# Patient Record
Sex: Male | Born: 1965 | Race: White | Hispanic: No | Marital: Married | Smoking: Former smoker
Health system: Southern US, Community
[De-identification: ages and names within clinical notes are randomized; demographics above are authoritative.]

## PROBLEM LIST (undated history)

## (undated) DIAGNOSIS — K611 Rectal abscess: Secondary | ICD-10-CM

## (undated) DIAGNOSIS — N400 Enlarged prostate without lower urinary tract symptoms: Secondary | ICD-10-CM

## (undated) DIAGNOSIS — E079 Disorder of thyroid, unspecified: Secondary | ICD-10-CM

## (undated) DIAGNOSIS — G4700 Insomnia, unspecified: Secondary | ICD-10-CM

## (undated) DIAGNOSIS — T7840XA Allergy, unspecified, initial encounter: Secondary | ICD-10-CM

## (undated) DIAGNOSIS — F4320 Adjustment disorder, unspecified: Secondary | ICD-10-CM

## (undated) DIAGNOSIS — IMO0002 Reserved for concepts with insufficient information to code with codable children: Secondary | ICD-10-CM

## (undated) HISTORY — DX: Insomnia, unspecified: G47.00

## (undated) HISTORY — DX: Benign prostatic hyperplasia without lower urinary tract symptoms: N40.0

## (undated) HISTORY — DX: Adjustment disorder, unspecified: F43.20

## (undated) HISTORY — PX: HAND SURGERY: SHX662

## (undated) HISTORY — DX: Allergy, unspecified, initial encounter: T78.40XA

## (undated) HISTORY — DX: Rectal abscess: K61.1

## (undated) HISTORY — DX: Reserved for concepts with insufficient information to code with codable children: IMO0002

## (undated) HISTORY — DX: Disorder of thyroid, unspecified: E07.9

## (undated) HISTORY — PX: TONSILLECTOMY: SHX5217

---

## 2006-05-04 ENCOUNTER — Encounter: Payer: Self-pay | Admitting: Internal Medicine

## 2006-09-29 ENCOUNTER — Encounter: Payer: Self-pay | Admitting: Internal Medicine

## 2007-10-04 ENCOUNTER — Encounter: Payer: Self-pay | Admitting: Internal Medicine

## 2007-11-09 ENCOUNTER — Encounter: Payer: Self-pay | Admitting: Internal Medicine

## 2008-03-13 ENCOUNTER — Ambulatory Visit: Payer: Self-pay | Admitting: Internal Medicine

## 2008-03-13 DIAGNOSIS — E039 Hypothyroidism, unspecified: Secondary | ICD-10-CM | POA: Insufficient documentation

## 2008-03-13 DIAGNOSIS — K6289 Other specified diseases of anus and rectum: Secondary | ICD-10-CM | POA: Insufficient documentation

## 2008-03-13 DIAGNOSIS — N400 Enlarged prostate without lower urinary tract symptoms: Secondary | ICD-10-CM | POA: Insufficient documentation

## 2008-03-13 DIAGNOSIS — N401 Enlarged prostate with lower urinary tract symptoms: Secondary | ICD-10-CM

## 2008-03-15 ENCOUNTER — Ambulatory Visit: Payer: Self-pay | Admitting: Internal Medicine

## 2008-03-15 LAB — CONVERTED CEMR LAB
ALT: 25 units/L (ref 0–53)
AST: 29 units/L (ref 0–37)
Albumin: 4.3 g/dL (ref 3.5–5.2)
Alkaline Phosphatase: 47 units/L (ref 39–117)
BUN: 11 mg/dL (ref 6–23)
Basophils Absolute: 0 10*3/uL (ref 0.0–0.1)
Basophils Relative: 0.1 % (ref 0.0–3.0)
Bilirubin, Direct: 0.2 mg/dL (ref 0.0–0.3)
CO2: 30 meq/L (ref 19–32)
Calcium: 9.3 mg/dL (ref 8.4–10.5)
Chloride: 106 meq/L (ref 96–112)
Cholesterol: 211 mg/dL (ref 0–200)
Creatinine, Ser: 1.1 mg/dL (ref 0.4–1.5)
Direct LDL: 98.4 mg/dL
Eosinophils Absolute: 0.3 10*3/uL (ref 0.0–0.7)
Eosinophils Relative: 3.7 % (ref 0.0–5.0)
GFR calc Af Amer: 94 mL/min
GFR calc non Af Amer: 77 mL/min
Glucose, Bld: 102 mg/dL — ABNORMAL HIGH (ref 70–99)
HCT: 41.9 % (ref 39.0–52.0)
HDL: 51.3 mg/dL (ref >39.0)
Hemoglobin: 14.6 g/dL (ref 13.0–17.0)
Lymphocytes Relative: 22.9 % (ref 12.0–46.0)
MCHC: 35 g/dL (ref 30.0–36.0)
MCV: 97.4 fL (ref 78.0–100.0)
Monocytes Absolute: 0.8 10*3/uL (ref 0.1–1.0)
Monocytes Relative: 10.9 % (ref 3.0–12.0)
Neutro Abs: 4.6 10*3/uL (ref 1.4–7.7)
Neutrophils Relative %: 62.4 % (ref 43.0–77.0)
Platelets: 231 10*3/uL (ref 150–400)
Potassium: 4.4 meq/L (ref 3.5–5.1)
RBC: 4.3 M/uL (ref 4.22–5.81)
RDW: 12.2 % (ref 11.5–14.6)
Sodium: 139 meq/L (ref 135–145)
TSH: 1.28 microintl units/mL (ref 0.35–5.50)
Total Bilirubin: 1.6 mg/dL — ABNORMAL HIGH (ref 0.3–1.2)
Total CHOL/HDL Ratio: 4.1
Total Protein: 6.8 g/dL (ref 6.0–8.3)
Triglycerides: 81 mg/dL (ref 0–149)
VLDL: 16 mg/dL (ref 0–40)
WBC: 7.4 10*3/uL (ref 4.5–10.5)

## 2008-03-17 ENCOUNTER — Ambulatory Visit: Payer: Self-pay | Admitting: Internal Medicine

## 2008-03-17 DIAGNOSIS — G4700 Insomnia, unspecified: Secondary | ICD-10-CM | POA: Insufficient documentation

## 2008-05-08 ENCOUNTER — Telehealth: Payer: Self-pay | Admitting: Internal Medicine

## 2008-09-18 ENCOUNTER — Ambulatory Visit: Payer: Self-pay | Admitting: Internal Medicine

## 2008-09-18 LAB — CONVERTED CEMR LAB: TSH: 1 microintl units/mL (ref 0.35–5.50)

## 2008-10-02 ENCOUNTER — Telehealth: Payer: Self-pay | Admitting: Internal Medicine

## 2008-11-16 ENCOUNTER — Telehealth: Payer: Self-pay | Admitting: Internal Medicine

## 2009-02-14 ENCOUNTER — Ambulatory Visit: Payer: Self-pay | Admitting: Internal Medicine

## 2009-02-15 ENCOUNTER — Encounter: Payer: Self-pay | Admitting: Internal Medicine

## 2009-03-14 LAB — CONVERTED CEMR LAB
ALT: 14 units/L (ref 0–53)
AST: 19 units/L (ref 0–37)
Albumin: 4.6 g/dL (ref 3.5–5.2)
Alkaline Phosphatase: 46 units/L (ref 39–117)
BUN: 10 mg/dL (ref 6–23)
Bilirubin, Direct: 0.1 mg/dL (ref 0.0–0.3)
CO2: 26 meq/L (ref 19–32)
Calcium: 9.6 mg/dL (ref 8.4–10.5)
Chloride: 105 meq/L (ref 96–112)
Cholesterol: 197 mg/dL (ref 0–200)
Creatinine, Ser: 1.12 mg/dL (ref 0.40–1.50)
Glucose, Bld: 92 mg/dL (ref 70–99)
HDL: 40 mg/dL (ref >39)
Indirect Bilirubin: 0.6 mg/dL (ref 0.0–0.9)
LDL Cholesterol: 119 mg/dL — ABNORMAL HIGH (ref 0–99)
Potassium: 4.6 meq/L (ref 3.5–5.3)
Sodium: 140 meq/L (ref 135–145)
TSH: 2.081 microintl units/mL (ref 0.350–4.500)
Total Bilirubin: 0.7 mg/dL (ref 0.3–1.2)
Total CHOL/HDL Ratio: 4.9
Total Protein: 6.9 g/dL (ref 6.0–8.3)
Triglycerides: 191 mg/dL — ABNORMAL HIGH (ref <150)
VLDL: 38 mg/dL (ref 0–40)

## 2009-08-17 ENCOUNTER — Ambulatory Visit: Payer: Self-pay | Admitting: Internal Medicine

## 2009-08-17 LAB — CONVERTED CEMR LAB
ALT: 22 units/L (ref 0–53)
AST: 22 units/L (ref 0–37)
Albumin: 4.7 g/dL (ref 3.5–5.2)
Alkaline Phosphatase: 47 units/L (ref 39–117)
BUN: 17 mg/dL (ref 6–23)
Basophils Absolute: 0 10*3/uL (ref 0.0–0.1)
Basophils Relative: 1 % (ref 0–1)
Bilirubin Urine: NEGATIVE
Bilirubin, Direct: 0.2 mg/dL (ref 0.0–0.3)
CO2: 26 meq/L (ref 19–32)
Calcium: 9.5 mg/dL (ref 8.4–10.5)
Chloride: 103 meq/L (ref 96–112)
Cholesterol: 217 mg/dL — ABNORMAL HIGH (ref 0–200)
Creatinine, Ser: 1.21 mg/dL (ref 0.40–1.50)
Eosinophils Absolute: 0.2 10*3/uL (ref 0.0–0.7)
Eosinophils Relative: 4 % (ref 0–5)
Glucose, Bld: 100 mg/dL — ABNORMAL HIGH (ref 70–99)
HCT: 43.3 % (ref 39.0–52.0)
HDL: 44 mg/dL (ref >39)
Hemoglobin, Urine: NEGATIVE
Hemoglobin: 14.6 g/dL (ref 13.0–17.0)
Indirect Bilirubin: 0.9 mg/dL (ref 0.0–0.9)
Ketones, ur: NEGATIVE mg/dL
LDL Cholesterol: 146 mg/dL — ABNORMAL HIGH (ref 0–99)
Leukocytes, UA: NEGATIVE
Lymphocytes Relative: 47 % — ABNORMAL HIGH (ref 12–46)
Lymphs Abs: 2.1 10*3/uL (ref 0.7–4.0)
MCHC: 33.7 g/dL (ref 30.0–36.0)
MCV: 95.2 fL (ref 78.0–100.0)
Monocytes Absolute: 0.6 10*3/uL (ref 0.1–1.0)
Monocytes Relative: 12 % (ref 3–12)
Neutro Abs: 1.6 10*3/uL — ABNORMAL LOW (ref 1.7–7.7)
Neutrophils Relative %: 35 % — ABNORMAL LOW (ref 43–77)
Nitrite: NEGATIVE
Platelets: 236 10*3/uL (ref 150–400)
Potassium: 4.4 meq/L (ref 3.5–5.3)
Protein, ur: NEGATIVE mg/dL
RBC: 4.55 M/uL (ref 4.22–5.81)
RDW: 12.2 % (ref 11.5–15.5)
Sodium: 138 meq/L (ref 135–145)
Specific Gravity, Urine: 1.02 (ref 1.005–1.030)
TSH: 2.474 microintl units/mL (ref 0.350–4.500)
Total Bilirubin: 1.1 mg/dL (ref 0.3–1.2)
Total CHOL/HDL Ratio: 4.9
Total Protein: 6.8 g/dL (ref 6.0–8.3)
Triglycerides: 136 mg/dL (ref <150)
Urine Glucose: NEGATIVE mg/dL
Urobilinogen, UA: 0.2 (ref 0.0–1.0)
VLDL: 27 mg/dL (ref 0–40)
WBC: 4.5 10*3/uL (ref 4.0–10.5)
pH: 6.5 (ref 5.0–8.0)

## 2009-08-21 ENCOUNTER — Ambulatory Visit: Payer: Self-pay | Admitting: Internal Medicine

## 2009-08-21 DIAGNOSIS — G5600 Carpal tunnel syndrome, unspecified upper limb: Secondary | ICD-10-CM | POA: Insufficient documentation

## 2009-08-29 ENCOUNTER — Encounter: Payer: Self-pay | Admitting: Internal Medicine

## 2009-09-12 ENCOUNTER — Encounter: Payer: Self-pay | Admitting: Internal Medicine

## 2009-11-13 ENCOUNTER — Telehealth: Payer: Self-pay | Admitting: Family

## 2009-11-13 ENCOUNTER — Ambulatory Visit: Payer: Self-pay | Admitting: Family

## 2009-11-13 ENCOUNTER — Ambulatory Visit (HOSPITAL_BASED_OUTPATIENT_CLINIC_OR_DEPARTMENT_OTHER): Admission: RE | Admit: 2009-11-13 | Discharge: 2009-11-13 | Payer: Self-pay | Admitting: Internal Medicine

## 2009-11-13 ENCOUNTER — Ambulatory Visit: Payer: Self-pay | Admitting: Diagnostic Radiology

## 2009-11-13 DIAGNOSIS — R1013 Epigastric pain: Secondary | ICD-10-CM | POA: Insufficient documentation

## 2009-11-13 LAB — CONVERTED CEMR LAB
AST: 21 units/L (ref 0–37)
Albumin: 4.8 g/dL (ref 3.5–5.2)
Alkaline Phosphatase: 43 units/L (ref 39–117)
Basophils Relative: 0 % (ref 0–1)
Hemoglobin: 14.6 g/dL (ref 13.0–17.0)
Indirect Bilirubin: 0.9 mg/dL (ref 0.0–0.9)
Lymphs Abs: 0.4 10*3/uL — ABNORMAL LOW (ref 0.7–4.0)
MCHC: 34 g/dL (ref 30.0–36.0)
MCV: 94.3 fL (ref 78.0–100.0)
Monocytes Absolute: 0.5 10*3/uL (ref 0.1–1.0)
Monocytes Relative: 6 % (ref 3–12)
Neutro Abs: 7.2 10*3/uL (ref 1.7–7.7)
RBC: 4.55 M/uL (ref 4.22–5.81)
Total Bilirubin: 1.1 mg/dL (ref 0.3–1.2)

## 2009-11-14 ENCOUNTER — Telehealth: Payer: Self-pay | Admitting: Family

## 2009-11-27 ENCOUNTER — Encounter (INDEPENDENT_AMBULATORY_CARE_PROVIDER_SITE_OTHER): Payer: Self-pay | Admitting: *Deleted

## 2009-11-27 ENCOUNTER — Ambulatory Visit: Payer: Self-pay | Admitting: Family

## 2009-12-28 ENCOUNTER — Encounter (INDEPENDENT_AMBULATORY_CARE_PROVIDER_SITE_OTHER): Payer: Self-pay | Admitting: *Deleted

## 2009-12-28 ENCOUNTER — Ambulatory Visit: Payer: Self-pay | Admitting: Gastroenterology

## 2010-01-14 ENCOUNTER — Ambulatory Visit: Payer: Self-pay | Admitting: Gastroenterology

## 2010-01-14 LAB — CONVERTED CEMR LAB: UREASE: NEGATIVE

## 2010-01-16 ENCOUNTER — Encounter: Payer: Self-pay | Admitting: Gastroenterology

## 2010-01-28 ENCOUNTER — Encounter: Payer: Self-pay | Admitting: Internal Medicine

## 2010-01-28 LAB — CONVERTED CEMR LAB: TSH: 2.056 microintl units/mL (ref 0.350–4.500)

## 2010-02-04 ENCOUNTER — Telehealth: Payer: Self-pay | Admitting: Internal Medicine

## 2010-02-05 ENCOUNTER — Encounter: Payer: Self-pay | Admitting: Internal Medicine

## 2010-02-12 ENCOUNTER — Ambulatory Visit (HOSPITAL_BASED_OUTPATIENT_CLINIC_OR_DEPARTMENT_OTHER): Admission: RE | Admit: 2010-02-12 | Discharge: 2010-02-12 | Payer: Self-pay | Admitting: Orthopedic Surgery

## 2010-02-22 ENCOUNTER — Ambulatory Visit: Payer: Self-pay | Admitting: Family

## 2010-02-22 DIAGNOSIS — L989 Disorder of the skin and subcutaneous tissue, unspecified: Secondary | ICD-10-CM | POA: Insufficient documentation

## 2010-02-22 DIAGNOSIS — K644 Residual hemorrhoidal skin tags: Secondary | ICD-10-CM | POA: Insufficient documentation

## 2010-08-01 ENCOUNTER — Encounter: Payer: Self-pay | Admitting: Internal Medicine

## 2010-08-02 ENCOUNTER — Encounter: Payer: Self-pay | Admitting: Internal Medicine

## 2010-08-05 ENCOUNTER — Telehealth: Payer: Self-pay | Admitting: Internal Medicine

## 2010-08-08 ENCOUNTER — Ambulatory Visit: Admit: 2010-08-08 | Payer: Self-pay | Admitting: Internal Medicine

## 2010-08-14 LAB — CONVERTED CEMR LAB: TSH: 2.35 microintl units/mL (ref 0.350–4.500)

## 2010-08-20 NOTE — Progress Notes (Signed)
Summary: u/s result, pls call with blood wk result  Phone Note Outgoing Call   Summary of Call: Left message for patient to return our call, abdominal ultrasound is normal. Initial call taken by: Lemont Fillers FNP,  November 13, 2009 4:38 PM  Follow-up for Phone Call        Pt returned my call and was notified of normal u/s result. Pt asked about blood work result and was notified that labs not final yet and we will call him with results.  Mervin Kung CMA  November 14, 2009 8:52 AM   Additional Follow-up for Phone Call Additional follow up Details #1::        called patient, reviewed labs.  States he is feeling a little better. Recommended that patient schedule follow up in 2 weeks.  He verbalizes understanding. Additional Follow-up by: Lemont Fillers FNP,  November 14, 2009 11:11 AM

## 2010-08-20 NOTE — Progress Notes (Signed)
Summary: TSH result  Phone Note Outgoing Call   Summary of Call: call pt - TSH is in normal range.  keep taking same dose of thyroid medication.  arrange repeat TSH in 6 months Initial call taken by: D. Thomos Lemons DO,  February 04, 2010 6:11 PM  Follow-up for Phone Call        Pt notified of result.  Repeat TSH scheduled for 08/08/09 @ [facility] [place]. Appt. reminder mailed to pt. Order faxed to lab.  Nicki Guadalajara Fergerson CMA Duncan Dull)  February 05, 2010 9:33 AM

## 2010-08-20 NOTE — Letter (Signed)
Summary: [facility]   Imported By: Lanelle Bal 09/27/2009 07:59:37  _____________________________________________________________________  External Attachment:    Type:   Image     Comment:   External Document

## 2010-08-20 NOTE — Miscellaneous (Signed)
Summary: lab order--tsh  Clinical Lists Changes  Orders: Added new Test order of T-TSH [phone]) - Signed

## 2010-08-20 NOTE — Miscellaneous (Signed)
Summary: Orders Update  Clinical Lists Changes  Orders: Added new Test order of TLB-H Pylori Screen Gastric Biopsy ([id number]) - Signed 

## 2010-08-20 NOTE — Procedures (Signed)
Summary: Upper Endoscopy  Patient: [name] Note: All result statuses are Final unless otherwise noted.  Tests: (1) Upper Endoscopy (EGD)   EGD Upper Endoscopy       DONE     [facility]     [address]     [place], [place]  [address]           ENDOSCOPY PROCEDURE REPORT           PATIENT:  Joel Owen, Joel Owen  MR#:  [id number]     BIRTHDATE:  [birth year], 44 yrs. old  GENDER:  male           ENDOSCOPIST:  Vania Rea. Jarold Motto, MD, [facility]     Referred by:           PROCEDURE DATE:  01/14/2010     PROCEDURE:  EGD with biopsy     ASA CLASS:  Class I     INDICATIONS:  abdominal pain           MEDICATIONS:   Fentanyl 75 mcg IV, Versed 8 mg IV     TOPICAL ANESTHETIC:           DESCRIPTION OF PROCEDURE:   After the risks benefits and     alternatives of the procedure were thoroughly explained, informed     consent was obtained.  The [facility] GIF-H180 [id number] endoscope was     introduced through the mouth and advanced to the second portion of     the duodenum, without limitations.  The instrument was slowly     withdrawn as the mucosa was fully examined.     <<PROCEDUREIMAGES>>           A hiatal hernia was found. SMALL 2 CM.HH NOTED  The upper, middle,     and distal third of the esophagus were carefully inspected and no     abnormalities were noted. The z-line was well seen at the GEJ. The     endoscope was pushed into the fundus which was normal including a     retroflexed view. The antrum,gastric body, first and second part     of the duodenum were unremarkable. CLO AND REGULAR BIOPSIES DONE     FOR H.PYLORI.    Retroflexed views revealed no abnormalities.     The scope was then withdrawn from the patient and the procedure     completed.           COMPLICATIONS:  None           ENDOSCOPIC IMPRESSION:     1) Hiatal hernia     2) Normal EGD     RECURRENT PROBABLE PUD.R/O H.PYLORI     RECOMMENDATIONS:     1) Await biopsy results     2) Rx CLO if positive           REPEAT  EXAM:  No           ______________________________     Vania Rea. Jarold Motto, MD, Clementeen Graham           CC:  Thomos Lemons, DO           n.     eSIGNED:   Vania Rea. Kaliyah Gladman at 01/14/2010 03:47 PM           Mechele Collin, [id number]  Note: An exclamation mark (!) indicates a result that was not dispersed into the flowsheet. Document Creation Date: 01/14/2010 3:47 PM _______________________________________________________________________  (1) Order result status: Final Collection or  observation date-time: 01/14/2010 15:40 Requested date-time:  Receipt date-time:  Reported date-time:  Referring Physician:   Ordering Physician: Sheryn Bison [phone]) Specimen Source:  Source: Launa Grill Order Number: [id number] Lab site:

## 2010-08-20 NOTE — Assessment & Plan Note (Signed)
Summary: CPX/HEA   Vital Signs:  Patient profile:   45 year old male Height:      72 inches Weight:      203.75 pounds BMI:     27.73 O2 Sat:      100 % on Room air Temp:     98.3 degrees F oral Pulse rate:   67 / minute Pulse rhythm:   regular Resp:     18 per minute BP sitting:   120 / 80  (left arm) Cuff size:   large  Vitals Entered By: Glendell Docker CMA (August 21, 2009 8:42 AM)  O2 Flow:  Room air  Primary Care Provider:  D. Thomos Lemons DO  CC:  CPX.  History of Present Illness: CPX  45 y/o white male for routine CPX.  int hx - occ feels mild rectal symptoms from prev abscess.   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    Physical Exam  General:  alert, well-developed, and well-nourished.   Head:  normocephalic and atraumatic.   Eyes:  pupils equal, pupils round, and pupils reactive to light.   Ears:  R ear normal and L ear normal.   Mouth:  Oral mucosa and oropharynx without lesions or exudates.  Teeth in good repair. Neck:  No  deformities, masses, or tenderness noted.no carotid bruits.   Lungs:  normal respiratory effort, normal breath sounds, no crackles, and no wheezes.   Heart:  normal rate, regular rhythm, no murmur, and no gallop.   Abdomen:  soft, non-tender, normal bowel sounds, no masses, no hepatomegaly, and no splenomegaly.   Msk:  positive tinels - right wrist.  slightly atrophy of thenar eminence Extremities:  No lower extremity edema  Neurologic:  cranial nerves II-XII intact and gait normal.   Psych:  normally interactive, good eye contact, not anxious appearing, and not depressed appearing.     Impression & Recommendations:  Problem # 1:  HEALTH MAINTENANCE EXAM (ICD-V70.0)  Reviewed adult health maintenance protocols.  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  no improvement with wrist splints.  refer to ortho  Orders: Orthopedic Referral (Ortho)  Complete Medication List: 1)  Synthroid 88 Mcg Tabs (Levothyroxine sodium) .... Take 1 tablet by mouth once a  day 2)  Viagra 100 Mg Tabs (Sildenafil citrate) .... Take 1 tablet by mouth once a day as needed 3)  Sulfamethoxazole-tmp Ds 800-160 Mg Tabs (Sulfamethoxazole-trimethoprim) .... One by mouth bid  Patient Instructions: 1)  Please schedule a follow-up appointment in 1 year 2)  TSH: 244.9 3)  Lab work in 6 months Prescriptions: SYNTHROID 88 MCG TABS (LEVOTHYROXINE SODIUM) Take 1 tablet by mouth once a day  #90 x 3   Entered and Authorized by:   D. Thomos Lemons DO   Signed by:   D. Thomos Lemons DO on 08/21/2009   Method used:   Electronically to        [id number] (retail)       [address] [place], [place]  [address]       Ph: [phone]       Fax:  [phone]   RxID:   [id number] VIAGRA 100 MG TABS (SILDENAFIL CITRATE) Take 1 tablet by mouth once a day as needed  #10 x 11   Entered and Authorized by:   D. Thomos Lemons DO   Signed by:   D. Thomos Lemons DO on 08/21/2009   Method used:   Print then Give to Patient   RxID:   [id number] SULFAMETHOXAZOLE-TMP DS 800-160 MG TABS (SULFAMETHOXAZOLE-TRIMETHOPRIM) one by mouth bid  #20 x 0   Entered and Authorized by:   D. Thomos Lemons DO   Signed by:   D. Thomos Lemons DO on 08/21/2009   Method used:   Print then Give to Patient   RxID:   [id number]    Immunization History:  Influenza Immunization History:    Influenza:  historical (05/07/2009)   Current Allergies (reviewed today): No known allergies

## 2010-08-20 NOTE — Letter (Signed)
Summary: New Patient letter  [facility]  [address] [place], [place] [address]   Phone: [phone]  Fax: [phone]       11/27/2009 MRN: [id number]  Joel Owen [address] [place], [place]  [address]  Dear Joel Owen,  Welcome to the Gastroenterology Division at [facility].    You are scheduled to see Dr.  Marina Goodell  on 12-26-09 at 3:30PM on the 3rd floor at [facility], [address]  We ask that you try to arrive at our office 15 minutes prior to your appointment time to allow for check-in.  We would like you to complete the enclosed self-administered evaluation form prior to your visit and bring it with you on the day of your appointment.  We will review it with you.  Also, please bring a complete list of all your medications or, if you prefer, bring the medication bottles and we will list them.  Please bring your insurance card so that we may make a copy of it.  If your insurance requires a referral to see a specialist, please bring your referral form from your primary care physician.  Co-payments are due at the time of your visit and may be paid by cash, check or credit card.     Your office visit will consist of a consult with your physician (includes a physical exam), any laboratory testing he/she may order, scheduling of any necessary diagnostic testing (e.g. x-ray, ultrasound, CT-scan), and scheduling of a procedure (e.g. Endoscopy, Colonoscopy) if required.  Please allow enough time on your schedule to allow for any/all of these possibilities.    If you cannot keep your appointment, please call [phone] to cancel or reschedule prior to your appointment date.  This allows [place] the opportunity to schedule an appointment for another patient in need of care.  If you do not cancel or reschedule by 5 p.m. the business day prior to your appointment date, you will be charged a $50.00 late cancellation/no-show fee.    Thank you for choosing [facility] for your medical needs.  We appreciate the opportunity to care for you.  Please visit [place] at our website  to learn more about our practice.                     Sincerely,                                                             The Gastroenterology Division

## 2010-08-20 NOTE — Miscellaneous (Signed)
Summary: TSH order  Clinical Lists Changes  Orders: Added new Test order of T-TSH ([id number]) - Signed 

## 2010-08-20 NOTE — Assessment & Plan Note (Signed)
Summary: freeze spot on L leg & hemorrhoids/dt--Rm 5   Vital Signs:  Patient profile:   45 year old male Height:      72 inches Weight:      196.50 pounds BMI:     26.75 Temp:     98.0 degrees F oral Pulse rate:   66 / minute Pulse rhythm:   regular Resp:     16 per minute BP sitting:   128 / 80  (right arm) Cuff size:   regular  Vitals Entered By: Mervin Kung CMA Duncan Dull) (February 22, 2010 10:38 AM) CC: Room 5  Pt states he has hemorrhoids. Would like to have places on arms and left leg assessed. Is Patient Diabetic? No   Primary Care Provider:  Dondra Spry DO  CC:  Room 5  Pt states he has hemorrhoids. Would like to have places on arms and left leg assessed.Marland Kitchen  History of Present Illness: Patient notes that he had carpal tunnel sugery several weeks ago.  He used pain meds post-operatively which caused him to become constipation.  As a result of his constiption, he has noticed for several weeks since that his hemorroids have been bothering him.  He has tried OTC Preparation H tucks pads, Epsom salt baths, and stool softners without improvement.  Denies rectal bleeding.  Has used "foam" in the past with good relief.  Also interested in having spot on leg looked at + 2 spots on arm.    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 No apparent alopecia or balding. Rectal:  + external hemorrhoid noted. Skin:  small flat slightly hypopigmented patches (one on right leg, 2 on arms)   Impression & Recommendations:  Problem # 1:  HEMORRHOIDS, EXTERNAL (ICD-455.3) Assessment Deteriorated Exacerbated by recent constipation.  Recommended that he continue the epsom salt baths, start miralax, trial of proctofoam.  If no improvement with the measures, pt was instructed to call us.  Problem # 2:  SKIN LESIONS, MULTIPLE (ICD-709.9) Assessment: Improved Skin lesions are benign appearing and barely visible- pt notes that they were more dry/flakey over the winter.  No need to freeze at this time- monitor.   Complete Medication List: 1)  Synthroid 88 Mcg Tabs (Levothyroxine sodium) .... Take 1 tablet by mouth once a day 2)  Viagra 100 Mg Tabs (Sildenafil citrate) .... Take 1 tablet by mouth once a day as needed 3)  Nexium 40 Mg Pack (Esomeprazole magnesium) .... One tablet by mouth daily 4)  Miralax Powd (Polyethylene glycol 3350) .Marland KitchenMarland KitchenMarland Kitchen 17 grams in 8 ounces of juice or water once daily 5)  Proctofoam Hc 1-1 % Foam (Hydrocortisone ace-pramoxine) .Marland KitchenMarland KitchenMarland Kitchen  Apply twice daily to the affected area x 1 week  Patient Instructions: 1)  Call if you develp bleeding from your hemorrhoid, if symptoms worsen, or if they do not improve. Prescriptions: PROCTOFOAM HC 1-1 % FOAM (HYDROCORTISONE ACE-PRAMOXINE) apply twice daily to the affected area x 1 week  #1 x 1   Entered and Authorized by:   Lemont Fillers FNP   Signed by:   Lemont Fillers FNP on 02/22/2010   Method used:   [facility] to        [phone][facility] (retail)       [address] [place], [place]  [address]       Ph: [phone]        Fax: [phone]   RxID:   [id number]   Current Allergies (reviewed today): No known allergies

## 2010-08-20 NOTE — Consult Note (Signed)
Summary: [facility]   Imported By: Lanelle Bal 09/13/2009 10:19:15  _____________________________________________________________________  External Attachment:    Type:   Image     Comment:   External Document

## 2010-08-20 NOTE — Assessment & Plan Note (Signed)
Summary: ULCER LIKE SYMPTOMS/MHF   Vital Signs:  Patient profile:   45 year old male Height:      72 inches Weight:      200.75 pounds BMI:     27.33 Temp:     98.6 degrees F oral Pulse rate:   96 / minute Pulse rhythm:   regular Resp:     16 per minute BP sitting:   130 / 82  (right arm) Cuff size:   regular  Vitals Entered By: Mervin Kung CMA (November 13, 2009 11:30 AM) CC: room 5  Pt states he has had a dull upper abd. pain with a couple of diarrhead episodes x 1 day. Diarrhea is better today, OTC meds have not helped abd. pain.  He states he had a similar episode a couple of years ago. Is Patient Diabetic? No Pain Assessment Patient in pain? yes     Location: abdomen Intensity: 6 Type: dull Onset of pain  Sudden   Primary Care Provider:  Dondra Spry DO  CC:  room 5  Pt states he has had a dull upper abd. pain with a couple of diarrhead episodes x 1 day. Diarrhea is better today and OTC meds have not helped abd. pain.  He states he had a similar episode a couple of years ago.Marland Kitchen  History of Present Illness: Mr Dulay is a 45 year old male who presents today with c/o abdominal pain which started last night.  Pain worsend at work.  Tried alka-selzer/tums without improvement.  Pt attributes these symptoms to red sauce and spicy food last night.  Pain is dull/epigastric rated 6/10.  3 "explosive BM's" yesterday.  Denies vomitting.  Denies nausea, + anorexia.  Reports history of similar symptoms several years ago- was treated with nexium and symptoms resolved.  Denies use of NSAIDS, denies melena or hematochezia.  Notes both mother and sistery have history of cholecystitis.   Allergies (verified): No Known Drug Allergies  Physical Exam  General:  Well-developed,well-nourished,in no acute distress; alert,appropriate and cooperative throughout examination Head:  Normocephalic and atraumatic without obvious abnormalities. No apparent alopecia or balding. Lungs:  Normal  respiratory effort, chest expands symmetrically. Lungs are clear to auscultation, no crackles or wheezes. Heart:  Normal rate and regular rhythm. S1 and S2 normal without gallop, murmur, click, rub or other extra sounds. Abdomen:  + bowel sounds.  Abdomen soft.  Very mild epigastric tenderness without guarding or rebound tenderness.   Impression & Recommendations:  Problem # 1:  EPIGASTRIC PAIN (ICD-789.06) Assessment New May be due to GERD/ulcer.  Will also check LFT's amylase, lipase and abdominal ultrasound to evaluated for pancreatitis, cholecystits.  Plan for patient to follow up in 2 weeks.  If symptoms persist or if they worsen plan referral to GI.   Pt instructed to go to ER if he develops severe abdominal pain, melena, hematochezia.   Orders: Misc. Referral (Misc. Ref) T-Hepatic Function [phone]) T-CBC w/Diff [phone]) T-Amylase [phone]) T-Lipase [phone])  Complete Medication List: 1)  Synthroid 88 Mcg Tabs (Levothyroxine sodium) .... Take 1 tablet by mouth once a day 2)  Viagra 100 Mg Tabs (Sildenafil citrate) .... Take 1 tablet by mouth once a day as needed 3)  Nexium 40 Mg Pack (Esomeprazole magnesium) .... One tablet by mouth daily  Patient Instructions: 1)  Please complete lab work and ultrasound today. 2)  Call if symptoms worsen (go to ER if severe) 3)  Follow up in 2 weeks.   Prescriptions: NEXIUM 40 MG  PACK (ESOMEPRAZOLE MAGNESIUM) one tablet by mouth daily  #30 x 1   Entered and Authorized by:   Lemont Fillers FNP   Signed by:   Lemont Fillers FNP on 11/13/2009   Method used:   [facility]ron[facility] to        [phone][facility] (retail)       [address] [place], [place]  [address]       Ph: [phone]       Fax: [phone]   RxID:   [id number]   Current Allergies (reviewed today): No known allergies

## 2010-08-20 NOTE — Assessment & Plan Note (Signed)
Summary: 1 week follow up/mhf   Vital Signs:  Patient profile:   45 year old male Height:      72 inches Weight:      199.50 pounds BMI:     27.15 Temp:     97.5 degrees F oral Pulse rate:   60 / minute Pulse rhythm:   regular Resp:     16 per minute BP sitting:   118 / 84  (right arm) Cuff size:   large  Vitals Entered By: Joel Owen CMA (Nov 27, 2009 9:26 AM) CC: room 4  1 week follow up. Is Patient Diabetic? No   Primary Care Provider:  Dondra Spry DO  CC:  room 4  1 week follow up.Marland Kitchen  History of Present Illness: Joel Owen is a 45 year old male who presents today for follow up of his epigastric pain and anorexia.  Last week he reported his pain as dull/epigastric in nature and rated his pain as 6/10.  He was placed on Nexium. Today he reports resolution of his epigastric pain. Denies black or bloody stools.  He did note some diarrhea last week which has resolved. .   Now he has returned to his baseline bowel function which is one loose stool a day. He notes that he has had similar symptoms two other times in the last 10 years which have resolved with initiation of Nexium.    Allergies (verified): No Known Drug Allergies  Physical Exam  General:  Well-developed,well-nourished,in no acute distress; alert,appropriate and cooperative throughout examination Lungs:  Normal respiratory effort, chest expands symmetrically. Lungs are clear to auscultation, no crackles or wheezes. Heart:  Normal rate and regular rhythm. S1 and S2 normal without gallop, murmur, click, rub or other extra sounds. Abdomen:  Bowel sounds positive,abdomen soft and non-tender without masses, organomegaly or hernias noted.   Impression & Recommendations:  Problem # 1:  EPIGASTRIC PAIN (ICD-789.06) Assessment Improved Lab work was unremarkable. Plan to continue PPI.  Suspect symptoms related to GERD +/- gastritis/ulcer.  Will plan referral to GI for further evaluation and possibly endoscopy given  recurrent nature of patient;s symptoms.  Patient also has concerns over chronic loose stools.    Gastroenterology Referral (GI)  Complete Medication List: 1)  Synthroid 88 Mcg Tabs (Levothyroxine sodium) .... Take 1 tablet by mouth once a day 2)  Viagra 100 Mg Tabs (Sildenafil citrate) .... Take 1 tablet by mouth once a day as needed 3)  Nexium 40 Mg Pack (Esomeprazole magnesium) .... One tablet by mouth daily  Patient Instructions: 1)  Continue Nexium. 2)  You will be contacted about your referral to Gastroenterology. Prescriptions: NEXIUM 40 MG PACK (ESOMEPRAZOLE MAGNESIUM) one tablet by mouth daily  #30 x 5   Entered and Authorized by:   Joel Fillers FNP   Signed by:   Joel Fillers FNP on 11/27/2009   Method used:   Electronically to        [facility] [phone][facility] (retail)       [address] [place], [place]  [address]       Ph: [phone]       Fax: [phone]   RxID:   [id number]   Current Allergies (reviewed today): No known allergies

## 2010-08-20 NOTE — Assessment & Plan Note (Signed)
Summary: EPIGASTRIC PAIN/YF   History of Present Illness Visit Type: Initial Consult Primary GI MD: Sheryn Bison MD FACP FAGA Primary Provider: Dondra Spry DO Requesting Provider: Sandford Craze, MD Chief Complaint: Pt has had 3 episodes over 1 year of epigastric pain, loss of appetite. Pt states he started on back on a PPI. Pt states he no longer has these symptoms but kept his appt anyway.  History of Present Illness:   45 year old Caucasian male referred for evaluation of recurrent epigastric abdominal pain over the last 7-8 years. These episodes began spontaneously without real precipitating events except for occasional spicy foods. Patient been treated with Nexium on 3 occasions with good response. He does drink beer regularly but denies alcohol abuse problems. There's been no history of hepatitis, pancreatitis, or other gastrointestinal or general medical problems except for mild hyporthyroidism.  The patient denies abuse of NSAIDs or cigarettes. He has not had nocturnal wakening, nausea vomiting, or any hepatobiliary symptoms. Review of labs and recent ultrasound were all normal.He has one regular solid bowel movement day without rectal bleeding or melena. He denies anorexia, weight loss, or any specific food intolerances.     GI Review of Systems      Denies abdominal pain, acid reflux, belching, bloating, chest pain, dysphagia with liquids, dysphagia with solids, heartburn, loss of appetite, nausea, vomiting, vomiting blood, weight loss, and  weight gain.        Denies anal fissure, black tarry stools, change in bowel habit, constipation, diarrhea, diverticulosis, fecal incontinence, heme positive stool, irritable bowel syndrome, jaundice, light color stool, liver problems, rectal bleeding, and  rectal pain.    Current Medications (verified): 1)  Synthroid 88 Mcg Tabs (Levothyroxine Sodium) .... Take 1 Tablet By Mouth Once A Day 2)  Viagra 100 Mg Tabs (Sildenafil Citrate)  .... Take 1 Tablet By Mouth Once A Day As Needed 3)  Nexium 40 Mg Pack (Esomeprazole Magnesium) .... One Tablet By Mouth Daily  Allergies (verified): No Known Drug Allergies  Past History:  Past medical, surgical, family and social histories (including risk factors) reviewed for relevance to current acute and chronic problems.  Past Medical History: Reviewed history from 08/21/2009 and no changes required. Hypothyroidism BPH Insomnia  History of adjustment disorder with depressed mood   hx of rectal abscess -s/p surgery   Past Surgical History: Reviewed history from 08/21/2009 and no changes required. Tonsillectomy 1971       Family History: Reviewed history from 08/21/2009 and no changes required. Mother is 53 y/o - htn Father is 98 y/o - Gout,  prostate problems CAD - no Stoke - no Breast Ca - no Colon Ca - no Prostate Ca - no       Social History: Reviewed history from 08/21/2009 and no changes required. Occupation:  Curator Divorced No children Alcohol use-yes (Avg 10 beers per week) Drug use-no       Review of Systems       The patient complains of change in vision.  The patient denies allergy/sinus, anemia, anxiety-new, arthritis/joint pain, back pain, blood in urine, breast changes/lumps, confusion, cough, coughing up blood, depression-new, fainting, fatigue, fever, headaches-new, hearing problems, heart murmur, heart rhythm changes, itching, menstrual pain, muscle pains/cramps, night sweats, nosebleeds, pregnancy symptoms, shortness of breath, skin rash, sleeping problems, sore throat, swelling of feet/legs, swollen lymph glands, thirst - excessive , urination - excessive , urination changes/pain, urine leakage, vision changes, and voice change.    Vital Signs:  Patient profile:   44  year old male Height:      72 inches Weight:      199.38 pounds BMI:     27.14 Pulse rate:   60 / minute Pulse rhythm:   regular BP sitting:   112 / 70  (right arm) Cuff  size:   regular  Vitals Entered By: Christie Nottingham CMA Duncan Dull) (December 28, 2009 9:03 AM)  Physical Exam  General:  Well developed, well nourished, no acute distress.healthy appearing.   Head:  Normocephalic and atraumatic. Eyes:  PERRLA, no icterus.exam deferred to patient's ophthalmologist.   Neck:  Supple; no masses or thyromegaly. Lungs:  Clear throughout to auscultation. Heart:  Regular rate and rhythm; no murmurs, rubs,  or bruits. Abdomen:  Soft, nontender and nondistended. No masses, hepatosplenomegaly or hernias noted. Normal bowel sounds. Extremities:  No clubbing, cyanosis, edema or deformities noted. Neurologic:  Alert and  oriented x4;  grossly normal neurologically. Psych:  Alert and cooperative. Normal mood and affect.   Impression & Recommendations:  Problem # 1:  EPIGASTRIC PAIN (ICD-789.06) Assessment Improved Probable H. pylori infection with recurrent peptic ulcer disease versus mucosal damage from ethanol use. I have asked him to continue daily Nexium and we will schedule outpatient endoscopy with biopsies for H. pylori. I see no need for repeat labs at this time.  Problem # 2:  SCREENING COLORECTAL-CANCER (ICD-V76.51) Assessment: Unchanged Colonoscopy at age 31 recommended  Problem # 3:  HYPOTHYROIDISM (ICD-244.9) Assessment: Improved Continue Synthroid 88 micrograms a day.  Patient Instructions: 1)  Copy sent to : Melissa O. Lendell Caprice, nurse practitioner 2)  Please continue current medications.  3)  Copy sent to : Dr. Thomos Lemons  4)  Conscious Sedation brochure given.  5)  Upper Endoscopy brochure given.   Appended Document: EPIGASTRIC PAIN/YF    Clinical Lists Changes  Orders: Added new Test order of EGD (EGD) - Signed

## 2010-08-20 NOTE — Letter (Signed)
Summary: Patient [name] Biopsy Results  [facility]  [address] [place], [place] [address]   Phone: [phone]  Fax: [phone]        January 16, 2010 MRN: [id number]    Joel Owen [address] [place], [place]  [address]    Dear [name],  I am pleased to inform you that the biopsies taken during your recent endoscopic examination did not show any evidence of cancer upon pathologic examination.  Additional information/recommendations:  __No further action is needed at this time.  Please follow-up with      your primary care physician for your other healthcare needs.  __ Please call [phone] to schedule a return visit to review      your condition.  _x_ Continue with the treatment plan as outlined on the day of your      exam.No helicobacter infection noted in biopsies.  __ You should have a repeat endoscopic examination for this problem              in _ months/years.   Please call us if you are having persistent problems or have questions about your condition that have not been fully answered at this time.  Sincerely,  Mardella Layman MD [facility]  This letter has been electronically signed by your physician.  Appended Document: Patient Notice-Endo Biopsy Results letter mailed 7.6.11

## 2010-08-20 NOTE — Letter (Signed)
Summary: EGD Instructions  [facility]  [address] [place], [place] [address]   Phone: [phone]  Fax: [phone]       Joel Owen    04-30-1966    MRN: [id number]       Procedure Day /Date: Monday, 01/14/10     Arrival Time: 3:00     Procedure Time: 4:00     Location of Procedure:                    [facility] (4th Floor)    PREPARATION FOR ENDOSCOPY   On 01/14/10 THE DAY OF THE PROCEDURE:  1.   No solid foods, milk or milk products are allowed after midnight the night before your procedure.  2.   Do not drink anything colored red or purple.  Avoid juices with pulp.  No orange juice.  3.  You may drink clear liquids until 2:00, which is 2 hours before your procedure.                                                                                                CLEAR LIQUIDS INCLUDE: Water Jello Ice Popsicles Tea (sugar ok, no milk/cream) Powdered fruit flavored drinks Coffee (sugar ok, no milk/cream) Gatorade Juice: apple, white grape, white cranberry  Lemonade Clear bullion, consomm, broth Carbonated beverages (any kind) Strained chicken noodle soup Hard Candy   MEDICATION INSTRUCTIONS  Unless otherwise instructed, you should take regular prescription medications with a small sip of water as early as possible the morning of your procedure.                    OTHER INSTRUCTIONS  You will need a responsible adult at least 47 years of age to accompany you and drive you home.   This person must remain in the waiting room during your procedure.  Wear loose fitting clothing that is easily removed.  Leave jewelry and other valuables at home.  However, you may wish to bring a book to read or an iPod/MP3 player to listen to music as you wait for your procedure to start.  Remove all body piercing jewelry and leave at home.  Total time from sign-in until discharge is approximately 2-3 hours.  You should go home  directly after your procedure and rest.  You can resume normal activities the day after your procedure.  The day of your procedure you should not:   Drive   Make legal decisions   Operate machinery   Drink alcohol   Return to work  You will receive specific instructions about eating, activities and medications before you leave.    The above instructions have been reviewed and explained to me by   _______________________    I fully understand and can verbalize these instructions _____________________________ Date _________

## 2010-08-20 NOTE — Progress Notes (Signed)
Summary: Lab Results  Phone Note Call from Patient Call back at Home Phone [phone]   Caller: Patient Reason for Call: Lab or Test Results Summary of Call: Patient called and left voice message requesting his lab results Initial call taken by: Glendell Docker CMA,  November 14, 2009 2:29 PM  Follow-up for Phone Call        done at 11:15 this am. Follow-up by: Lemont Fillers FNP,  November 14, 2009 2:56 PM

## 2010-08-21 ENCOUNTER — Encounter: Payer: Self-pay | Admitting: Internal Medicine

## 2010-08-21 ENCOUNTER — Ambulatory Visit: Admit: 2010-08-21 | Payer: Self-pay | Admitting: Internal Medicine

## 2010-08-21 ENCOUNTER — Encounter (INDEPENDENT_AMBULATORY_CARE_PROVIDER_SITE_OTHER): Payer: 59 | Admitting: Internal Medicine

## 2010-08-21 DIAGNOSIS — E785 Hyperlipidemia, unspecified: Secondary | ICD-10-CM | POA: Insufficient documentation

## 2010-08-21 DIAGNOSIS — Z0000 Encounter for general adult medical examination without abnormal findings: Secondary | ICD-10-CM

## 2010-08-22 LAB — CONVERTED CEMR LAB
LDL Cholesterol: 123 mg/dL — ABNORMAL HIGH (ref 0–99)
Triglycerides: 78 mg/dL (ref <150)
VLDL: 16 mg/dL (ref 0–40)

## 2010-08-22 NOTE — Progress Notes (Signed)
Summary: Synthroid Refill  Phone Note Refill Request Message from:  Fax from Pharmacy on August 05, 2010 9:21 AM  Refills Requested: Medication #1:  SYNTHROID 88 MCG TABS Take 1 tablet by mouth once a day   Dosage confirmed as above?Dosage Confirmed   Brand Name Necessary? No   Supply Requested: 3 months   [facility] [address] [facility] [phone]   Method Requested: Electronic Next Appointment Scheduled: 08-08-10 LAB  Initial call taken by: Roselle Locus,  August 05, 2010 9:23 AM  Follow-up for Phone Call        ok to refill x 1 until next OV Follow-up by: D. Thomos Lemons DO,  August 05, 2010 12:35 PM  Additional Follow-up for Phone Call Additional follow up Details #1::        Rx sent electronically to pharmacy Glendell Docker CMA  August 05, 2010 1:55 PM     Prescriptions: SYNTHROID 88 MCG TABS (LEVOTHYROXINE SODIUM) Take 1 tablet by mouth once a day  #30 x 0   Entered by:   Glendell Docker CMA   Authorized by:   D. Thomos Lemons DO   Signed by:   Glendell Docker CMA on 08/05/2010   Method used:   [facility] [phone][facility] (retail)       [address] [place], [place]  [address]       Ph: [phone]       Fax: [phone]   RxID:   [id number]

## 2010-08-26 ENCOUNTER — Encounter: Payer: Self-pay | Admitting: Internal Medicine

## 2010-08-28 NOTE — Assessment & Plan Note (Signed)
Summary: cpx/mhf   Vital Signs:  Patient profile:   45 year old male Height:      72 inches Weight:      198.25 pounds BMI:     26.98 O2 Sat:      100 % on Room air Temp:     98.2 degrees F oral Pulse rate:   66 / minute Resp:     16 per minute BP sitting:   114 / 76  (right arm) Cuff size:   large  Vitals Entered By: Glendell Docker CMA (August 21, 2010 8:45 AM)  O2 Flow:  Room air CC: CPX, Back Pain Is Patient Diabetic? No Pain Assessment Patient in pain? no      Comments right carpal tunnel surgey in right hand july 2011, fistula-seton-procdure-11/30-Dr Carolynne Edouard   Primary Care Provider:  Dondra Spry DO  CC:  CPX and Back Pain.  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 Pylori 12/2009 ( Dr. Jarold Motto ) Right carpal tunnel hx - Dr. 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    Physical Exam  General:  alert, well-developed, and well-nourished.   Head:  normocephalic and atraumatic.   Eyes:  pupils equal and pupils round.   Ears:  R ear normal and L ear normal.   Lungs:  normal respiratory effort, normal breath sounds, no crackles, and no wheezes.   Heart:  normal rate, regular rhythm, no murmur, and no gallop.   Abdomen:  soft, non-tender, normal bowel sounds, no hepatomegaly, and no splenomegaly.   Neurologic:  cranial nerves II-XII intact and gait normal.   Psych:  normally interactive, good eye contact, not anxious appearing, and not depressed appearing.     Impression & Recommendations:  Problem # 1:  HEALTH MAINTENANCE EXAM (ICD-V70.0) Reviewed adult health maintenance protocols.  Td Booster: given (10/05/2007)   Flu Vax: Historical (04/30/2010)   Chol: 217 (08/17/2009)   HDL: 44 (08/17/2009)   LDL: 146 (08/17/2009)   TG: 136 (08/17/2009) TSH: 2.350 (08/14/2010)  Complete Medication List: 1)  Synthroid 88 Mcg Tabs (Levothyroxine sodium) .... Take 1 tablet by mouth once a day 2)  Viagra 100 Mg Tabs (Sildenafil citrate) .... Take 1 tablet by mouth once a day as needed 3)  Nexium 40 Mg Pack (Esomeprazole magnesium) .... One tablet by mouth daily  Other Orders: T-Lipid Profile ([id number]) CRP, high sensitivity-[facility] [phone]) T- * Misc. Laboratory test [id number])  Patient Instructions: 1)  Please schedule a follow-up appointment in 1 year. 2)  Please schedule a follow-up appointment as needed.   Orders Added: 1)  T-Lipid Profile [[id number]] 2)  CRP, high sensitivity-[facility] [[id number]] 3)   T- * Misc. Laboratory test [99999] 4)  Est. Patient 47-64 years [[address]396]   Immunization History:  Influenza Immunization History:    Influenza:  historical (04/30/2010)   Immunization History:  Influenza Immunization History:    Influenza:  Historical (04/30/2010)   Current Allergies (reviewed today): No known allergies

## 2010-09-05 NOTE — Letter (Signed)
   [place] [facility] [address] [place], [place]  [address]  [place] Phone: [phone]      August 26, 2010   ANDREU DRUDGE [address] [place], [place] [address]  RE:  LAB RESULTS  Dear  Mr. Showers,  The following is an interpretation of your most recent lab tests.  Please take note of any instructions provided or changes to medications that have resulted from your lab work.  LIPID PANEL:  Stable - no changes needed Triglyceride: 78   Cholesterol: 185   LDL: 123   HDL: 46   Chol/HDL%:  4.0 Ratio  THYROID STUDIES:  Thyroid studies normal TSH: 2.350     C Reactive Protein - normal (excellent)      Sincerely Yours,    Dr. Thomos Lemons  Appended Document:  mailed 08/26/10

## 2010-10-07 ENCOUNTER — Telehealth: Payer: Self-pay | Admitting: Internal Medicine

## 2010-10-10 ENCOUNTER — Telehealth: Payer: Self-pay | Admitting: Internal Medicine

## 2010-10-10 NOTE — Telephone Encounter (Signed)
viagra 100mg  tablet by mouth once a day as needed qty 10 last fill 1.7.12

## 2010-10-14 NOTE — Telephone Encounter (Signed)
Rx filled 10/07/2010

## 2010-10-17 NOTE — Progress Notes (Signed)
Summary: refill-viagra  Phone Note Refill Request   Refills Requested: Medication #1:  VIAGRA 100 MG TABS Take 1 tablet by mouth once a day as needed   Dosage confirmed as above?Dosage Confirmed   Brand Name Necessary? No   [facility] Requested[facility] [address] [place] [address] fax [phone]   Method Requested: Electronic Next Appointment Scheduled: 2.4.13 Sandford Diop Initial call taken by: Elba Barman,  October 07, 2010 8:54 AM    Prescriptions: VIAGRA 100 MG TABS (SILDENAFIL CITRATE) Take 1 tablet by mouth once a day as needed  #10 x 11   Entered by:   Glendell Docker CMA   Authorized by:   D. Thomos Lemons DO   Signed by:   Glendell Docker CMA on 10/07/2010   Method used:   [facility]ronically [facility]        [facility] [phone][facility] (retail)       [address] [place], [place]  [address]       Ph: [phone]       Fax: [phone]   RxID:   [id number]

## 2010-11-08 ENCOUNTER — Ambulatory Visit (HOSPITAL_BASED_OUTPATIENT_CLINIC_OR_DEPARTMENT_OTHER): Admission: RE | Admit: 2010-11-08 | Payer: 59 | Source: Ambulatory Visit | Admitting: Orthopedic Surgery

## 2010-12-02 ENCOUNTER — Telehealth: Payer: Self-pay | Admitting: Internal Medicine

## 2010-12-02 DIAGNOSIS — E039 Hypothyroidism, unspecified: Secondary | ICD-10-CM

## 2010-12-02 MED ORDER — LEVOTHYROXINE SODIUM 88 MCG PO TABS: 88.0000 ug | ORAL_TABLET | Freq: Every day | ORAL | Status: DC

## 2010-12-02 NOTE — Telephone Encounter (Signed)
Refill- synthroid tablet. Take 1 tablet by mouth once a day. Qty 30. Last fill 4.14.12

## 2010-12-02 NOTE — Telephone Encounter (Signed)
Rx refill sent to pharmacy. 

## 2011-02-04 ENCOUNTER — Telehealth: Payer: Self-pay | Admitting: *Deleted

## 2011-02-04 DIAGNOSIS — E039 Hypothyroidism, unspecified: Secondary | ICD-10-CM

## 2011-02-04 NOTE — Telephone Encounter (Signed)
Patient states he gets his throid level  ck every 6 months   Needs order

## 2011-02-05 NOTE — Telephone Encounter (Signed)
Pt had CPE and TSH in 2/12.  Advised to follow up CPE in one year.  No advice on when to follow up TSH.  Does pt need to follow up now?  Please advise.

## 2011-02-05 NOTE — Telephone Encounter (Signed)
Fine for tsh and free t4 dx hypothyroidism

## 2011-02-06 LAB — TSH: TSH: 2.125 u[IU]/mL (ref 0.350–4.500)

## 2011-02-06 NOTE — Telephone Encounter (Signed)
Pt presented to the lab today for blood work

## 2011-02-06 NOTE — Telephone Encounter (Signed)
Pt has already had labs drawn.  Order entered a faxed to Katrina in lab.

## 2011-02-06 NOTE — Telephone Encounter (Signed)
Pending orders released and forwarded to the lab.

## 2011-02-06 NOTE — Telephone Encounter (Signed)
Addended by: Mervin Kung A on: 02/06/2011 11:57 AM   Modules accepted: Orders

## 2011-02-07 ENCOUNTER — Ambulatory Visit (HOSPITAL_BASED_OUTPATIENT_CLINIC_OR_DEPARTMENT_OTHER)
Admission: RE | Admit: 2011-02-07 | Discharge: 2011-02-07 | Disposition: A | Discharge status: Home / Self Care | Payer: 59 | Source: Ambulatory Visit | Attending: Orthopedic Surgery | Admitting: Orthopedic Surgery

## 2011-02-07 DIAGNOSIS — E039 Hypothyroidism, unspecified: Secondary | ICD-10-CM | POA: Insufficient documentation

## 2011-02-07 DIAGNOSIS — K219 Gastro-esophageal reflux disease without esophagitis: Secondary | ICD-10-CM | POA: Insufficient documentation

## 2011-02-07 DIAGNOSIS — G5600 Carpal tunnel syndrome, unspecified upper limb: Secondary | ICD-10-CM | POA: Insufficient documentation

## 2011-02-07 HISTORY — PX: CARPAL TUNNEL RELEASE: SHX101

## 2011-02-07 LAB — T4, FREE: Free T4: 1.1 ng/dL (ref 0.80–1.80)

## 2011-02-18 NOTE — Op Note (Addendum)
NAMEJAZION, [name]                  ACCOUNT NO.:  [id number]  MEDICAL RECORD NO.:  [id number]  LOCATION:                                 FACILITY:  PHYSICIAN:  Katy Fitch. Xzavian Semmel, M.D.      DATE OF BIRTH:  DATE OF PROCEDURE:  02/07/2011 DATE OF DISCHARGE:                              OPERATIVE REPORT   PREOPERATIVE DIAGNOSIS:  Entrapment neuropathy median nerve, left carpal tunnel.  POSTOPERATIVE DIAGNOSIS:  Entrapment neuropathy median nerve, left carpal tunnel.  OPERATION:  Release of left transverse carpal ligament.  OPERATING SURGEON:  Katy Fitch. Vonnie Spagnolo, MD  ASSISTANT:  Marveen Reeks Dasnoit, PA-C  ANESTHESIA:  General by LMA.  SUPERVISED ANESTHESIOLOGIST:  Bedelia Person, MD  INDICATIONS:  Joel Owen is a 45 year old gentleman employed by [employer] who has had a history of bilateral carpal tunnel syndrome.  He was referred through the courtesy of his primary care physician, Dr. Thomos Lemons for evaluation and management.  Clinical examination suggested median entrapment neuropathy at wrist level. Electrodiagnostic studies confirmed entrapment neuropathy.  Due to a failed respond to nonoperative measures, he was brought to the operating room on February 12, 2010, for release of his right transcarpal ligament. He has had a very satisfactory result following that procedure and now presents for similar surgery on the left.  Preoperatively, he was reminded of the potential risks and benefits of surgery.  Questions were invited and answered in detail.  PROCEDURE:  Joel Owen was brought to room one of the [facility] and placed in supine position on the operating table.  Following the induction of general anesthesia by LMA technique under Dr. Burnett Corrente direct supervision, the left arm was prepped with Betadine soap solution and sterilely draped.  A pneumatic tourniquet was applied to the proximal left brachium.  Upon exsanguination of the left arm with Esmarch bandage,  arterial tourniquet was inflated to 220 mmHg. Procedure commenced with a short incision in line of the ring finger and the palm.  Subcutaneous tissues were carefully divided revealing the palmar fascia.  This was split longitudinally to the common sensory branch of the median nerve and the superficial palmar arch.  The carpal canal was sound with a Catering manager.  A pathway was created palmar and deep to the transverse carpal ligament with a Penfield 4 elevator and scissors followed by release of the ligament along its ulnar border extending into the distal forearm.  This widely opened the carpal canal.  No masses or other predicaments noted.  Bleeding points were not problematic.  The wound was then repaired with intradermal 2-0 Prolene suture.  A compressive dressing was applied with a volar plaster splint maintaining the wrist in 10 degrees of dorsiflexion.  For aftercare, Joel Owen was provided prescription for Percocet 5 mg 1 p.o. q.4-6 h. p.r.n. pain 20 tablets without refill.  We will see him back for followup in a week for dressing change, suture removal and advancement to an exercise program.     Katy Fitch. Lean Jaeger, M.D.     RVS/MEDQ  D:  02/07/2011  T:  02/07/2011  Job:  [id number]  cc:   Barbette Hair. Artist Pais,  DO  Electronically Signed by Josephine Igo M.D. on 02/20/2011 09:59:07 AM

## 2011-02-20 ENCOUNTER — Ambulatory Visit (INDEPENDENT_AMBULATORY_CARE_PROVIDER_SITE_OTHER): Payer: 59 | Admitting: Internal Medicine

## 2011-02-20 ENCOUNTER — Encounter: Payer: Self-pay | Admitting: Internal Medicine

## 2011-02-20 DIAGNOSIS — E039 Hypothyroidism, unspecified: Secondary | ICD-10-CM

## 2011-02-20 DIAGNOSIS — L989 Disorder of the skin and subcutaneous tissue, unspecified: Secondary | ICD-10-CM

## 2011-02-20 DIAGNOSIS — N529 Male erectile dysfunction, unspecified: Secondary | ICD-10-CM

## 2011-02-20 MED ORDER — SILDENAFIL CITRATE 100 MG PO TABS: 100.0000 mg | ORAL_TABLET | Freq: Every day | ORAL | Status: DC | PRN

## 2011-02-20 NOTE — Patient Instructions (Signed)
Please schedule cbc, chem7, lipid, lft, psa, ua, tsh and free t4 1 week before Feb 2013 physical (v70.0)

## 2011-02-23 ENCOUNTER — Encounter: Payer: Self-pay | Admitting: Internal Medicine

## 2011-02-23 DIAGNOSIS — N529 Male erectile dysfunction, unspecified: Secondary | ICD-10-CM | POA: Insufficient documentation

## 2011-02-23 DIAGNOSIS — L989 Disorder of the skin and subcutaneous tissue, unspecified: Secondary | ICD-10-CM | POA: Insufficient documentation

## 2011-02-23 NOTE — Progress Notes (Signed)
  Subjective:    Patient ID: Joel Owen, male    DOB: [birth year], 45 y.o.   MRN: [id number]  HPI Pt presents to clinic for evaluation of multiple medical problems. C/o chronic intermittent ED previously responsive to viagra prn without adverse effects. Complains of right arm lesion that has been present x 1year but recently irritated. No suspicious changes recalled. H/o hypothyroidism without sx's of hyper or hypo. Reviewed nl tsh/ft4 7/12. CPE scheduled 2/13. No other complaints.  Reviewed pmh, medications and allergies.  Review of Systems see hpi    Objective:   Physical Exam  Nursing note and vitals reviewed. Constitutional: He appears well-developed and well-nourished. No distress.  HENT:  Head: Normocephalic and atraumatic.  Eyes: Conjunctivae are normal. No scleral icterus.  Neurological: He is alert.  Skin: Skin is warm and dry. No rash noted. He is not diaphoretic.       Right upper arm with small 1/2cm slightly raised hyperpigmented skin lesion. No malignant features. Appears slightly red and irritated.  Psychiatric: He has a normal mood and affect.          Assessment & Plan:

## 2011-02-23 NOTE — Assessment & Plan Note (Signed)
Rf viagra for prn use. No nitrate use.

## 2011-02-23 NOTE — Assessment & Plan Note (Signed)
Appears as benign lesion but irritated. Pt requests tx due to pain/irritation. Discussed risks, benefits and potential complications of cryotherapy. Stated understanding and a desire to proceed. Using liquid nitrogen and small cotton tipped applicator multiple brief applications for 2-3 seconds performed until halo effect achieved. Pt tolerated procedure without difficulty and will monitor the area for change over the next 1-2 wks.

## 2011-02-23 NOTE — Assessment & Plan Note (Signed)
Stable. Asx. Continue current dosing. 

## 2011-04-18 ENCOUNTER — Telehealth: Payer: Self-pay | Admitting: Internal Medicine

## 2011-04-18 DIAGNOSIS — Z0000 Encounter for general adult medical examination without abnormal findings: Secondary | ICD-10-CM

## 2011-04-18 NOTE — Telephone Encounter (Signed)
Please order labs for one week prior to 08-28-11 cpe visit. He will be going downstairs to [employer] [facility].

## 2011-04-18 NOTE — Telephone Encounter (Signed)
Labs entered for [place]  

## 2011-06-04 ENCOUNTER — Telehealth: Payer: Self-pay | Admitting: Internal Medicine

## 2011-06-04 DIAGNOSIS — E039 Hypothyroidism, unspecified: Secondary | ICD-10-CM

## 2011-06-04 MED ORDER — LEVOTHYROXINE SODIUM 88 MCG PO TABS: 88.0000 ug | ORAL_TABLET | Freq: Every day | ORAL | Status: DC

## 2011-06-04 NOTE — Telephone Encounter (Signed)
Refill synthroid tablet last fill 10 6 2012 qty 30 take 1 tablet by mouth daily

## 2011-06-04 NOTE — Telephone Encounter (Signed)
Rx refill sent to pharmacy. 

## 2011-08-25 ENCOUNTER — Encounter: Payer: 59 | Admitting: Internal Medicine

## 2011-08-28 ENCOUNTER — Encounter: Payer: 59 | Admitting: Internal Medicine

## 2011-08-29 LAB — LIPID PANEL
HDL: 39 mg/dL — ABNORMAL LOW (ref >39)
LDL Cholesterol: 135 mg/dL — ABNORMAL HIGH (ref 0–99)
Total CHOL/HDL Ratio: 4.9 Ratio
Triglycerides: 83 mg/dL (ref <150)
VLDL: 17 mg/dL (ref 0–40)

## 2011-08-29 LAB — CBC
Platelets: 224 10*3/uL (ref 150–400)
RBC: 4.39 MIL/uL (ref 4.22–5.81)
RDW: 12.1 % (ref 11.5–15.5)
WBC: 6.4 10*3/uL (ref 4.0–10.5)

## 2011-08-29 NOTE — Telephone Encounter (Signed)
Addended by: Mervin Kung A on: 08/29/2011 08:47 AM   Modules accepted: Orders

## 2011-08-29 NOTE — Telephone Encounter (Signed)
Addended by: Mervin Kung A on: 08/29/2011 08:43 AM   Modules accepted: Orders

## 2011-08-30 LAB — BASIC METABOLIC PANEL
CO2: 21 mEq/L (ref 19–32)
Chloride: 103 mEq/L (ref 96–112)
Creat: 1.18 mg/dL (ref 0.50–1.35)
Potassium: 4.3 mEq/L (ref 3.5–5.3)
Sodium: 139 mEq/L (ref 135–145)

## 2011-08-30 LAB — URINALYSIS, ROUTINE W REFLEX MICROSCOPIC
Bilirubin Urine: NEGATIVE
Nitrite: NEGATIVE
Protein, ur: NEGATIVE mg/dL
Urobilinogen, UA: 0.2 mg/dL (ref 0.0–1.0)

## 2011-08-30 LAB — HEPATIC FUNCTION PANEL
Albumin: 4.8 g/dL (ref 3.5–5.2)
Alkaline Phosphatase: 50 U/L (ref 39–117)
Total Protein: 6.7 g/dL (ref 6.0–8.3)

## 2011-08-30 LAB — PSA: PSA: 0.25 ng/mL (ref <=4.00)

## 2011-08-30 LAB — T4, FREE: Free T4: 1.33 ng/dL (ref 0.80–1.80)

## 2011-09-05 ENCOUNTER — Ambulatory Visit (INDEPENDENT_AMBULATORY_CARE_PROVIDER_SITE_OTHER): Payer: 59 | Admitting: Internal Medicine

## 2011-09-05 ENCOUNTER — Encounter: Payer: Self-pay | Admitting: Internal Medicine

## 2011-09-05 VITALS — BP 112/80 | HR 67 | Temp 98.1°F | Resp 16 | Ht 72.0 in | Wt 201.0 lb

## 2011-09-05 DIAGNOSIS — E039 Hypothyroidism, unspecified: Secondary | ICD-10-CM

## 2011-09-05 DIAGNOSIS — E785 Hyperlipidemia, unspecified: Secondary | ICD-10-CM

## 2011-09-05 DIAGNOSIS — Z0000 Encounter for general adult medical examination without abnormal findings: Secondary | ICD-10-CM

## 2011-09-05 MED ORDER — SILDENAFIL CITRATE 100 MG PO TABS: 100.0000 mg | ORAL_TABLET | Freq: Every day | ORAL | Status: DC | PRN

## 2011-09-05 MED ORDER — LEVOTHYROXINE SODIUM 88 MCG PO TABS: 88.0000 ug | ORAL_TABLET | Freq: Every day | ORAL | Status: DC

## 2011-09-05 NOTE — Patient Instructions (Signed)
Please schedule fasting lipid 272.4 and tsh/free t4 (hypothyroidism) prior to next visit

## 2011-09-05 NOTE — Assessment & Plan Note (Signed)
Nl exam. cpe labs reviewed. EKG obtained demonstrating SB 58 with nl intervals and axis.

## 2011-09-05 NOTE — Progress Notes (Signed)
  Subjective:    Patient ID: Joel Owen, male    DOB: 02-18-1966, 46 y.o.   MRN: [id number]  HPI Pt presents to clinic for annual physical. No current complaints. Reviewed chol mildly elevated despite low fat diet and regular exercise. Hypothyroidism under good control with stable dosing.   Past Medical History  Diagnosis Date  . Thyroid disease     hypothyroidism  . BPH (benign prostatic hyperplasia)   . Insomnia   . Adjustment disorder     with depressed mood  . Rectal abscess     history of, s/p surgery   Past Surgical History  Procedure Date  . Tonsillectomy     1971  . Carpal tunnel release 02/07/2011    Dr  Sylvester Harder    reports that he has quit smoking. He has quit using smokeless tobacco. He reports that he drinks about 6 ounces of alcohol per week. He reports that he does not use illicit drugs. family history includes Gout in his father and Hypertension in his mother. Allergies  Allergen Reactions  . Codeine     itching     Review of Systems see hpi     Objective:   Physical Exam  Physical Exam  Nursing note and vitals reviewed. Constitutional: He appears well-developed and well-nourished. No distress.  HENT:  Head: Normocephalic and atraumatic.  Right Ear: Tympanic membrane and external ear normal.  Left Ear: Tympanic membrane and external ear normal.  Nose: Nose normal.  Mouth/Throat: Uvula is midline, oropharynx is clear and moist and mucous membranes are normal. No oropharyngeal exudate.  Eyes: Conjunctivae and EOM are normal. Pupils are equal, round, and reactive to light. Right eye exhibits no discharge. Left eye exhibits no discharge. No scleral icterus.  Neck: Neck supple. Carotid bruit is not present. No thyromegaly present.  Cardiovascular: Normal rate, regular rhythm and normal heart sounds.  Exam reveals no gallop and no friction rub.   No murmur heard. Pulmonary/Chest: Effort normal and breath sounds normal. No respiratory distress. He has  no wheezes. He has no rales.  Abdominal: Soft. He exhibits no distension and no mass. There is no hepatosplenomegaly. There is no tenderness. There is no rebound. Hernia confirmed negative in the right inguinal area and confirmed negative in the left inguinal area.  Genitourinary: Rectum normal, prostate normal and testes normal. Rectal exam shows no mass and no tenderness. Guaiac negative stool. Prostate is not enlarged and not tender. Right testis shows no mass, no swelling and no tenderness. Right testis is descended. Left testis shows no mass, no swelling and no tenderness. Left testis is descended.  Lymphadenopathy:    He has no cervical adenopathy.       Right: No inguinal adenopathy present.       Left: No inguinal adenopathy present.  Neurological: He is alert.  Skin: Skin is warm and dry. He is not diaphoretic.  Psychiatric: He has a normal mood and affect.        Assessment & Plan:

## 2011-09-18 ENCOUNTER — Telehealth: Payer: Self-pay | Admitting: Internal Medicine

## 2011-09-18 NOTE — Telephone Encounter (Signed)
Call placed to [facility] spoke with Jacki Cones she has verified Rx on file from 09/05/2011. No refills are needed.

## 2012-03-02 LAB — T4, FREE: Free T4: 1.37 ng/dL (ref 0.80–1.80)

## 2012-03-02 LAB — LIPID PANEL
LDL Cholesterol: 123 mg/dL — ABNORMAL HIGH (ref 0–99)
VLDL: 25 mg/dL (ref 0–40)

## 2012-03-02 NOTE — Addendum Note (Signed)
Addended by: Regis Bill on: 03/02/2012 09:58 AM   Modules accepted: Orders

## 2012-03-10 ENCOUNTER — Telehealth: Payer: Self-pay | Admitting: Internal Medicine

## 2012-03-10 NOTE — Telephone Encounter (Signed)
Called pt no answer LMOM md response.Marland KitchenMarland Kitchen8/21/13@1 :51pm/LMB

## 2012-03-10 NOTE — Telephone Encounter (Signed)
Cholesterol is good, thyroid function looks good on current dose of levothyroxine. Continue same.

## 2012-03-10 NOTE — Telephone Encounter (Signed)
Patient is requesting last lab results 

## 2012-04-20 ENCOUNTER — Ambulatory Visit (INDEPENDENT_AMBULATORY_CARE_PROVIDER_SITE_OTHER): Payer: 59 | Admitting: Internal Medicine

## 2012-04-20 ENCOUNTER — Encounter: Payer: Self-pay | Admitting: Internal Medicine

## 2012-04-20 VITALS — BP 108/78 | HR 60 | Temp 98.0°F | Resp 16 | Wt 208.8 lb

## 2012-04-20 DIAGNOSIS — J329 Chronic sinusitis, unspecified: Secondary | ICD-10-CM

## 2012-04-20 MED ORDER — FLUTICASONE PROPIONATE 50 MCG/ACT NA SUSP: 2.0000 | Freq: Every day | NASAL | Status: DC

## 2012-04-20 MED ORDER — AMOXICILLIN-POT CLAVULANATE 875-125 MG PO TABS: 1.0000 | ORAL_TABLET | Freq: Two times a day (BID) | ORAL | Status: DC

## 2012-04-20 NOTE — Assessment & Plan Note (Signed)
Attempt ten-day course of Augmentin with Flonase. Followup if no improvement or worsening.

## 2012-04-20 NOTE — Progress Notes (Signed)
  Subjective:    Patient ID: Joel Owen, male    DOB: 1965-12-31, 46 y.o.   MRN: [id number]  HPI patient presents to clinic for evaluation of possible sinusitis. Notes one and a half week history of yellow green sinus drainage with nonproductive cough. Has had paranasal sinus pain and pressure with associated tooth pain. No fever or chills. No alleviating or exacerbating factors. Has been using Mucinex and Nettie pot. No other complaints.  Past Medical History  Diagnosis Date  . Thyroid disease     hypothyroidism  . BPH (benign prostatic hyperplasia)   . Insomnia   . Adjustment disorder     with depressed mood  . Rectal abscess     history of, s/p surgery   Past Surgical History  Procedure Date  . Tonsillectomy     1971  . Carpal tunnel release 02/07/2011    Dr  Sylvester Harder    reports that he has quit smoking. He has quit using smokeless tobacco. He reports that he drinks about 6 ounces of alcohol per week. He reports that he does not use illicit drugs. family history includes Gout in his father and Hypertension in his mother. Allergies  Allergen Reactions  . Codeine     itching     Review of Systems see history of present illness     Objective:   Physical Exam  Nursing note and vitals reviewed. Constitutional: He appears well-developed and well-nourished. No distress.  HENT:  Head: Normocephalic and atraumatic.  Right Ear: External ear normal.  Left Ear: External ear normal.  Nose: Nose normal. Right sinus exhibits no maxillary sinus tenderness and no frontal sinus tenderness. Left sinus exhibits no maxillary sinus tenderness and no frontal sinus tenderness.  Mouth/Throat: Oropharynx is clear and moist. No oropharyngeal exudate.  Eyes: Conjunctivae normal and EOM are normal. No scleral icterus.  Neck: Neck supple.  Neurological: He is alert.  Skin: He is not diaphoretic.  Psychiatric: He has a normal mood and affect.          Assessment & Plan:

## 2012-05-13 ENCOUNTER — Encounter: Payer: Self-pay | Admitting: Internal Medicine

## 2012-05-13 ENCOUNTER — Ambulatory Visit (INDEPENDENT_AMBULATORY_CARE_PROVIDER_SITE_OTHER): Payer: 59 | Admitting: Internal Medicine

## 2012-05-13 VITALS — BP 124/64 | HR 74 | Temp 97.7°F | Resp 12 | Ht 72.0 in

## 2012-05-13 DIAGNOSIS — J329 Chronic sinusitis, unspecified: Secondary | ICD-10-CM

## 2012-05-13 MED ORDER — AMOXICILLIN-POT CLAVULANATE 875-125 MG PO TABS: 1.0000 | ORAL_TABLET | Freq: Two times a day (BID) | ORAL | Status: AC

## 2012-05-13 MED ORDER — PREDNISONE 20 MG PO TABS: 20.0000 mg | ORAL_TABLET | Freq: Every day | ORAL | Status: DC

## 2012-05-13 NOTE — Assessment & Plan Note (Signed)
Improvement without resolution. Reattempt Augmentin course. Stop Flonase. Begin prednisone 20 mg daily for five days. Followup if no improvement or worsening.

## 2012-05-13 NOTE — Progress Notes (Signed)
  Subjective:    Patient ID: Joel Owen, male    DOB: 07/14/1966, 46 y.o.   MRN: [id number]  HPI patient presents to clinic for followup of sinusitis. Recently treated October 1 for paranasal sinusitis with course of Augmentin and Flonase. Notes improvement of symptoms however has persistent nasal drainage without fever or chills. Has continued paranasal pressure but improved. No teeth pain. No other alleviating or exacerbating factors.   Past Medical History  Diagnosis Date  . Thyroid disease     hypothyroidism  . BPH (benign prostatic hyperplasia)   . Insomnia   . Adjustment disorder     with depressed mood  . Rectal abscess     history of, s/p surgery   Past Surgical History  Procedure Date  . Tonsillectomy     1971  . Carpal tunnel release 02/07/2011    Dr  Sylvester Harder    reports that he has quit smoking. He has quit using smokeless tobacco. He reports that he drinks about 6 ounces of alcohol per week. He reports that he does not use illicit drugs. family history includes Gout in his father and Hypertension in his mother. Allergies  Allergen Reactions  . Codeine     itching     Review of Systems see history of present illness     Objective:   Physical Exam  Nursing note and vitals reviewed. Constitutional: He appears well-developed and well-nourished. No distress.  HENT:  Head: Normocephalic and atraumatic.  Right Ear: External ear normal.  Left Ear: External ear normal.  Nose: Nose normal. Right sinus exhibits no frontal sinus tenderness. Left sinus exhibits no frontal sinus tenderness.  Mouth/Throat: Oropharynx is clear and moist. No oropharyngeal exudate.       Mild bilateral paranasal tenderness  Eyes: Conjunctivae normal and EOM are normal. Right eye exhibits no discharge. Left eye exhibits no discharge. No scleral icterus.  Neck: Neck supple.  Skin: He is not diaphoretic.          Assessment & Plan:

## 2012-07-06 ENCOUNTER — Encounter: Payer: Self-pay | Admitting: Family

## 2012-07-06 ENCOUNTER — Ambulatory Visit (INDEPENDENT_AMBULATORY_CARE_PROVIDER_SITE_OTHER): Payer: 59 | Admitting: Family

## 2012-07-06 ENCOUNTER — Telehealth: Payer: Self-pay | Admitting: Family

## 2012-07-06 VITALS — BP 116/86 | HR 56 | Temp 98.0°F | Resp 16 | Wt 205.1 lb

## 2012-07-06 DIAGNOSIS — M79673 Pain in unspecified foot: Secondary | ICD-10-CM

## 2012-07-06 DIAGNOSIS — M79671 Pain in right foot: Secondary | ICD-10-CM | POA: Insufficient documentation

## 2012-07-06 DIAGNOSIS — M79609 Pain in unspecified limb: Secondary | ICD-10-CM

## 2012-07-06 LAB — URIC ACID: Uric Acid, Serum: 6.1 mg/dL (ref 4.0–7.8)

## 2012-07-06 NOTE — Progress Notes (Signed)
  Subjective:    Patient ID: Joel Owen, male    DOB: [birth year], 46 y.o.   MRN: [id number]  HPI  Mr. Paver is a 46 yr old male with chief complaint of foot pain- x 5-6 weeks.  Notes mild swelling. Pain is not severe. He has tried soaking, ice, aspercream, soaking.  He reports dad has hx of gout. Reports pain is worse with weight bearing. Denies injury to foot.  Review of Systems    see HPI  Past Medical History  Diagnosis Date  . Thyroid disease     hypothyroidism  . BPH (benign prostatic hyperplasia)   . Insomnia   . Adjustment disorder     with depressed mood  . Rectal abscess     history of, s/p surgery    History   Social History  . Marital Status: Legally Separated    Spouse Name: N/A    Number of Children: 0  . 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 Smoking status: Former Games developer  . Smokeless tobacco: Former Neurosurgeon  . Alcohol Use: 6.0 oz/week    10 Cans of beer per week  . Drug Use: No  . Sexually Active: Not on file   Other Topics Concern  . Not on file   Social History Narrative   Pt is divorced.      Past Surgical History  Procedure Date  . Tonsillectomy     1971  . Carpal tunnel release 02/07/2011    Dr  Sylvester Harder    Family History  Problem Relation Age of Onset  . Hypertension Mother   . Gout Father     Allergies  Allergen Reactions  . Codeine     itching    Current Outpatient Prescriptions on File Prior to Visit  Medication Sig Dispense Refill  . aspirin 81 MG tablet Take 81 mg by mouth daily.      . fluticasone (FLONASE) 50 MCG/ACT nasal spray Place 2 sprays into the nose daily.  16 g  6  . levothyroxine (SYNTHROID, LEVOTHROID) 88 MCG tablet Take 1 tablet (88 mcg total) by mouth daily.  30 tablet  11  . Red Yeast Rice Extract (RED YEAST RICE PO) Take by mouth every other day.       . sildenafil (VIAGRA) 100 MG tablet Take 1 tablet (100 mg total) by mouth  daily as needed.  10 tablet  6    BP 116/86  Pulse 56  Temp 98 F (36.7 C) (Oral)  Resp 16  Wt 205 lb 1.3 oz (93.024 kg)  SpO2 99%    Objective:   Physical Exam  Constitutional: He appears well-developed and well-nourished. No distress.  Musculoskeletal: He exhibits no edema.       + mild tenderness to palpation of plantar surface of right foot at base of middle toe.  No erythema, no swelling.  Psychiatric: He has a normal mood and affect. His behavior is normal. Judgment and thought content normal.          Assessment & Plan:

## 2012-07-06 NOTE — Telephone Encounter (Signed)
Left message requesting that the pt return our call.  When he calls back please let him know that I would like him to return to the lab please to complete uric acid level to check for gout.

## 2012-07-06 NOTE — Assessment & Plan Note (Signed)
Uric acid level is normal.  Suspect plantar fascitis vs OA.  Recommended short course of aleve and ice foot qhs.

## 2012-07-06 NOTE — Telephone Encounter (Signed)
Pt completetd bloodwork after visit today. Please disregard call.

## 2012-07-06 NOTE — Patient Instructions (Addendum)
Please complete your lab work prior to leaving.  Start aleve 220mg  twice daily for 1 week. Continue to ice your foot nightly. Call if symptoms worsen or if not improved in 1 week.

## 2012-07-23 ENCOUNTER — Telehealth: Payer: Self-pay | Admitting: Internal Medicine

## 2012-07-23 NOTE — Telephone Encounter (Signed)
Patient has cpe scheduled for February/2014. He would like to come in one week prior to this visit for labs. He would also like to get his thyroid level checked at lab visit. He will be going to [facility] lab

## 2012-08-21 ENCOUNTER — Other Ambulatory Visit: Payer: Self-pay | Admitting: Internal Medicine

## 2012-09-01 ENCOUNTER — Telehealth: Payer: Self-pay | Admitting: *Deleted

## 2012-09-01 DIAGNOSIS — Z0000 Encounter for general adult medical examination without abnormal findings: Secondary | ICD-10-CM

## 2012-09-01 LAB — CBC WITH DIFFERENTIAL/PLATELET
Basophils Absolute: 0 10*3/uL (ref 0.0–0.1)
Basophils Relative: 1 % (ref 0–1)
Eosinophils Absolute: 0.1 10*3/uL (ref 0.0–0.7)
Hemoglobin: 14.6 g/dL (ref 13.0–17.0)
MCH: 31.5 pg (ref 26.0–34.0)
MCHC: 34.7 g/dL (ref 30.0–36.0)
Monocytes Absolute: 0.6 10*3/uL (ref 0.1–1.0)
Monocytes Relative: 10 % (ref 3–12)
Neutrophils Relative %: 48 % (ref 43–77)
RDW: 13.2 % (ref 11.5–15.5)

## 2012-09-01 LAB — TSH: TSH: 6.59 u[IU]/mL — ABNORMAL HIGH (ref 0.350–4.500)

## 2012-09-01 LAB — URINALYSIS, ROUTINE W REFLEX MICROSCOPIC
Hgb urine dipstick: NEGATIVE
Leukocytes, UA: NEGATIVE
Nitrite: NEGATIVE
Protein, ur: NEGATIVE mg/dL
Urobilinogen, UA: 0.2 mg/dL (ref 0.0–1.0)

## 2012-09-01 LAB — HEPATIC FUNCTION PANEL
ALT: 16 U/L (ref 0–53)
Bilirubin, Direct: 0.1 mg/dL (ref 0.0–0.3)
Indirect Bilirubin: 0.8 mg/dL (ref 0.0–0.9)
Total Bilirubin: 0.9 mg/dL (ref 0.3–1.2)

## 2012-09-01 LAB — BASIC METABOLIC PANEL
BUN: 13 mg/dL (ref 6–23)
Calcium: 9.5 mg/dL (ref 8.4–10.5)
Chloride: 103 mEq/L (ref 96–112)
Creat: 1.19 mg/dL (ref 0.50–1.35)

## 2012-09-01 LAB — LIPID PANEL
Cholesterol: 201 mg/dL — ABNORMAL HIGH (ref 0–200)
HDL: 44 mg/dL (ref >39)
Triglycerides: 214 mg/dL — ABNORMAL HIGH (ref <150)
VLDL: 43 mg/dL — ABNORMAL HIGH (ref 0–40)

## 2012-09-01 NOTE — Telephone Encounter (Signed)
Pt presented to the lab. Orders placed per verbal from Provider for: bmp, lfts, cbc, ua, lipids, tsh. V70.0

## 2012-09-07 ENCOUNTER — Ambulatory Visit (INDEPENDENT_AMBULATORY_CARE_PROVIDER_SITE_OTHER): Payer: 59 | Admitting: Family Medicine

## 2012-09-07 ENCOUNTER — Encounter: Payer: Self-pay | Admitting: Family Medicine

## 2012-09-07 VITALS — BP 110/84 | HR 62 | Temp 98.0°F | Ht 72.0 in | Wt 201.0 lb

## 2012-09-07 DIAGNOSIS — E039 Hypothyroidism, unspecified: Secondary | ICD-10-CM

## 2012-09-07 DIAGNOSIS — M79609 Pain in unspecified limb: Secondary | ICD-10-CM

## 2012-09-07 DIAGNOSIS — K6289 Other specified diseases of anus and rectum: Secondary | ICD-10-CM

## 2012-09-07 DIAGNOSIS — Z0000 Encounter for general adult medical examination without abnormal findings: Secondary | ICD-10-CM

## 2012-09-07 DIAGNOSIS — M79671 Pain in right foot: Secondary | ICD-10-CM

## 2012-09-07 DIAGNOSIS — N529 Male erectile dysfunction, unspecified: Secondary | ICD-10-CM

## 2012-09-07 DIAGNOSIS — E785 Hyperlipidemia, unspecified: Secondary | ICD-10-CM

## 2012-09-07 DIAGNOSIS — G4700 Insomnia, unspecified: Secondary | ICD-10-CM

## 2012-09-07 DIAGNOSIS — IMO0002 Reserved for concepts with insufficient information to code with codable children: Secondary | ICD-10-CM

## 2012-09-07 DIAGNOSIS — M7710 Lateral epicondylitis, unspecified elbow: Secondary | ICD-10-CM

## 2012-09-07 HISTORY — DX: Reserved for concepts with insufficient information to code with codable children: IMO0002

## 2012-09-07 MED ORDER — LEVOTHYROXINE SODIUM 100 MCG PO TABS: 100.0000 ug | ORAL_TABLET | Freq: Every day | ORAL | Status: DC

## 2012-09-07 MED ORDER — KRILL OIL PO CAPS: ORAL_CAPSULE | ORAL | Status: DC

## 2012-09-07 MED ORDER — SILDENAFIL CITRATE 100 MG PO TABS: 100.0000 mg | ORAL_TABLET | Freq: Every day | ORAL | Status: DC | PRN

## 2012-09-07 NOTE — Assessment & Plan Note (Signed)
Ice and aspercreme. If pain persists then consider short course of physical therapy.

## 2012-09-07 NOTE — Patient Instructions (Addendum)
TSH in 12 weeks  Labs prior to next visit, lipid, renal, cbc, tsh, hepatic  Medial Epicondylitis (Golfer's Elbow) with Rehab Medial epicondylitis involves inflammation and pain around the inner (medial) portion of the elbow. This pain is caused by inflammation of the tendons in the forearm that flex (bring down) the wrist. Medial epicondylitis is also called golfer's elbow, because it is common among golfers. However, it may occur in any individual who flexes the wrist regularly. If medial epicondylitis is left untreated, it may become a chronic problem. SYMPTOMS   Pain, tenderness, or inflammation over the inner (medial) side of the elbow.  Pain or weakness with gripping activities.  Pain that increases with wrist twisting motions (using a screwdriver, playing golf, bowling). CAUSES  Medial epicondylitis is caused by inflammation of the tendons that flex the wrist. Causes of injury may include:  Chronic, repetitive stress and strain to the tendons that run from the wrist and forearm to the elbow.  Sudden strain on the forearm, including wrist snap when serving balls with racquet sports, or throwing a baseball. RISK INCREASES WITH:  Sports or occupations that require repetitive and/or strenuous forearm and wrist movements (pitching a baseball, golfing, carpentry).  Poor wrist and forearm strength and flexibility.  Failure to warm up properly before activity.  Resuming activity before healing, rehabilitation, and conditioning are complete. PREVENTION   Warm up and stretch properly before activity.  Maintain physical fitness:  Strength, flexibility, and endurance.  Cardiovascular fitness.  Wear and use properly fitted equipment.  Learn and use proper technique and have a coach correct improper technique.  Wear a tennis elbow (counterforce) brace. PROGNOSIS  The course of this condition depends on the degree of the injury. If treated properly, acute cases (symptoms lasting  less than 4 weeks) are often resolved in 2 to 6 weeks. Chronic (longer lasting cases) often resolve in 3 to 6 months, but may require physical therapy. RELATED COMPLICATIONS   Frequently recurring symptoms, resulting in a chronic problem. Properly treating the problem the first time decreases frequency of recurrence.  Chronic inflammation, scarring, and partial tendon tear, requiring surgery.  Delayed healing or resolution of symptoms. TREATMENT  Treatment first involves the use of ice and medicine, to reduce pain and inflammation. Strengthening and stretching exercises may reduce discomfort, if performed regularly. These exercises may be performed at home, if the condition is an acute injury. Chronic cases may require a referral to a physical therapist for evaluation and treatment. Your caregiver may advise a corticosteroid injection to help reduce inflammation. Rarely, surgery is needed. MEDICATION  If pain medicine is needed, nonsteroidal anti-inflammatory medicines (aspirin and ibuprofen), or other minor pain relievers (acetaminophen), are often advised.  Do not take pain medicine for 7 days before surgery.  Prescription pain relievers may be given, if your caregiver thinks they are needed. Use only as directed and only as much as you need.  Corticosteroid injections may be recommended. These injections should be reserved only for the most severe cases, because they can only be given a certain number of times. HEAT AND COLD  Cold treatment (icing) should be applied for 10 to 15 minutes every 2 to 3 hours for inflammation and pain, and immediately after activity that aggravates your symptoms. Use ice packs or an ice massage.  Heat treatment may be used before performing stretching and strengthening activities prescribed by your caregiver, physical therapist, or athletic trainer. Use a heat pack or a warm water soak. SEEK MEDICAL CARE IF: Symptoms  get worse or do not improve in 2 weeks,  despite treatment. EXERCISES  RANGE OF MOTION (ROM) AND STRETCHING EXERCISES - Epicondylitis, Medial (Golfer's Elbow) These exercises may help you when beginning to rehabilitate your injury. Your symptoms may go away with or without further involvement from your physician, physical therapist or athletic trainer. While completing these exercises, remember:   Restoring tissue flexibility helps normal motion to return to the joints. This allows healthier, less painful movement and activity.  An effective stretch should be held for at least 30 seconds.  A stretch should never be painful. You should only feel a gentle lengthening or release in the stretched tissue. RANGE OF MOTION  Wrist Flexion, Active-Assisted  Extend your right / left elbow with your fingers pointing down.*  Gently pull the back of your hand towards you, until you feel a gentle stretch on the top of your forearm.  Hold this position for __________ seconds. Repeat __________ times. Complete this exercise __________ times per day.  *If directed by your physician, physical therapist or athletic trainer, complete this stretch with your elbow bent, rather than extended. RANGE OF MOTION  Wrist Extension, Active-Assisted  Extend your right / left elbow and turn your palm upwards.*  Gently pull your palm and fingertips back, so your wrist extends and your fingers point more toward the ground.  You should feel a gentle stretch on the inside of your forearm.  Hold this position for __________ seconds. Repeat __________ times. Complete this exercise __________ times per day. *If directed by your physician, physical therapist or athletic trainer, complete this stretch with your elbow bent, rather than extended. STRETCH  Wrist Extension   Place your right / left fingertips on a tabletop leaving your elbow slightly bent. Your fingers should point backwards.  Gently press your fingers and palm down onto the table, by straightening  your elbow. You should feel a stretch on the inside of your forearm.  Hold this position for __________ seconds. Repeat __________ times. Complete this stretch __________ times per day.  STRENGTHENING EXERCISES - Epicondylitis, Medial (Golfer's Elbow) These exercises may help you when beginning to rehabilitate your injury. They may resolve your symptoms with or without further involvement from your physician, physical therapist or athletic trainer. While completing these exercises, remember:   Muscles can gain both the endurance and the strength needed for everyday activities through controlled exercises.  Complete these exercises as instructed by your physician, physical therapist or athletic trainer. Increase the resistance and repetitions only as guided.  You may experience muscle soreness or fatigue, but the pain or discomfort you are trying to eliminate should never worsen during these exercises. If this pain does get worse, stop and make sure you are following the directions exactly. If the pain is still present after adjustments, discontinue the exercise until you can discuss the trouble with your caregiver. STRENGTH Wrist Flexors  Sit with your right / left forearm palm-up, and fully supported on a table or countertop. Your elbow should be resting below the height of your shoulder. Allow your wrist to extend over the edge of the surface.  Loosely holding a __________ weight, or a piece of rubber exercise band or tubing, slowly curl your hand up toward your forearm.  Hold this position for __________ seconds. Slowly lower the wrist back to the starting position in a controlled manner. Repeat __________ times. Complete this exercise __________ times per day.  STRENGTH  Wrist Extensors  Sit with your right / left forearm  palm-down and fully supported. Your elbow should be resting below the height of your shoulder. Allow your wrist to extend over the edge of the surface.  Loosely holding  a __________ weight, or a piece of rubber exercise band or tubing, slowly curl your hand up toward your forearm.  Hold this position for __________ seconds. Slowly lower the wrist back to the starting position in a controlled manner. Repeat __________ times. Complete this exercise __________ times per day.  STRENGTH - Ulnar Deviators  Stand with a ____________________ weight in your right / left hand, or sit while holding a rubber exercise band or tubing, with your healthy arm supported on a table or countertop.  Move your wrist so that your pinkie travels toward your forearm and your thumb moves away from your forearm.  Hold this position for __________ seconds and then slowly lower the wrist back to the starting position. Repeat __________ times. Complete this exercise __________ times per day STRENGTH - Grip   Grasp a tennis ball, a dense sponge, or a large, rolled sock in your hand.  Squeeze as hard as you can, without increasing any pain.  Hold this position for __________ seconds. Release your grip slowly. Repeat __________ times. Complete this exercise __________ times per day.  STRENGTH  Forearm Supinators   Sit with your right / left forearm supported on a table, keeping your elbow below shoulder height. Rest your hand over the edge, palm down.  Gently grip a hammer or a soup ladle.  Without moving your elbow, slowly turn your palm and hand upward to a "thumbs-up" position.  Hold this position for __________ seconds. Slowly return to the starting position. Repeat __________ times. Complete this exercise __________ times per day.  STRENGTH  Forearm Pronators  Sit with your right / left forearm supported on a table, keeping your elbow below shoulder height. Rest your hand over the edge, palm up.  Gently grip a hammer or a soup ladle.  Without moving your elbow, slowly turn your palm and hand upward to a "thumbs-up" position.  Hold this position for __________ seconds.  Slowly return to the starting position. Repeat __________ times. Complete this exercise __________ times per day.  Document Released: 07/07/2005 Document Revised: 09/29/2011 Document Reviewed: 10/19/2008 [facility] Patient Information 2013 [place], [place].  Preventive Care for Adults, Male A healthy lifestyle and preventive care can promote health and wellness. Preventive health guidelines for men include the following key practices:  A routine yearly physical is a good way to check with your caregiver about your health and preventative screening. It is a chance to share any concerns and updates on your health, and to receive a thorough exam.  Visit your dentist for a routine exam and preventative care every 6 months. Brush your teeth twice a day and floss once a day. Good oral hygiene prevents tooth decay and gum disease.  The frequency of eye exams is based on your age, health, family medical history, use of contact lenses, and other factors. Follow your caregiver's recommendations for frequency of eye exams.  Eat a healthy diet. Foods like vegetables, fruits, whole grains, low-fat dairy products, and lean protein foods contain the nutrients you need without too many calories. Decrease your intake of foods high in solid fats, added sugars, and salt. Eat the right amount of calories for you.Get information about a proper diet from your caregiver, if necessary.  Regular physical exercise is one of the most important things you can do for your health. Most  adults should get at least 150 minutes of moderate-intensity exercise (any activity that increases your heart rate and causes you to sweat) each week. In addition, most adults need muscle-strengthening exercises on 2 or more days a week.  Maintain a healthy weight. The body mass index (BMI) is a screening tool to identify possible weight problems. It provides an estimate of body fat based on height and weight. Your caregiver can help determine your  BMI, and can help you achieve or maintain a healthy weight.For adults 20 years and older:  A BMI below 18.5 is considered underweight.  A BMI of 18.5 to 24.9 is normal.  A BMI of 25 to 29.9 is considered overweight.  A BMI of 30 and above is considered obese.  Maintain normal blood lipids and cholesterol levels by exercising and minimizing your intake of saturated fat. Eat a balanced diet with plenty of fruit and vegetables. Blood tests for lipids and cholesterol should begin at age 59 and be repeated every 5 years. If your lipid or cholesterol levels are high, you are over 50, or you are a high risk for heart disease, you may need your cholesterol levels checked more frequently.Ongoing high lipid and cholesterol levels should be treated with medicines if diet and exercise are not effective.  If you smoke, find out from your caregiver how to quit. If you do not use tobacco, do not start.  If you choose to drink alcohol, do not exceed 2 drinks per day. One drink is considered to be 12 ounces (355 mL) of beer, 5 ounces (148 mL) of wine, or 1.5 ounces (44 mL) of liquor.  Avoid use of street drugs. Do not share needles with anyone. Ask for help if you need support or instructions about stopping the use of drugs.  High blood pressure causes heart disease and increases the risk of stroke. Your blood pressure should be checked at least every 1 to 2 years. Ongoing high blood pressure should be treated with medicines, if weight loss and exercise are not effective.  If you are 5 to 49 years old, ask your caregiver if you should take aspirin to prevent heart disease.  Diabetes screening involves taking a blood sample to check your fasting blood sugar level. This should be done once every 3 years, after age 63, if you are within normal weight and without risk factors for diabetes. Testing should be considered at a younger age or be carried out more frequently if you are overweight and have at least 1  risk factor for diabetes.  Colorectal cancer can be detected and often prevented. Most routine colorectal cancer screening begins at the age of 74 and continues through age 8. However, your caregiver may recommend screening at an earlier age if you have risk factors for colon cancer. On a yearly basis, your caregiver may provide home test kits to check for hidden blood in the stool. Use of a small camera at the end of a tube, to directly examine the colon (sigmoidoscopy or colonoscopy), can detect the earliest forms of colorectal cancer. Talk to your caregiver about this at age 37, when routine screening begins. Direct examination of the colon should be repeated every 5 to 10 years through age 87, unless early forms of pre-cancerous polyps or small growths are found.  Hepatitis C blood testing is recommended for all people born from [birth year] through 1965 and any individual with known risks for hepatitis C.  Practice safe sex. Use condoms and avoid high-risk sexual  practices to reduce the spread of sexually transmitted infections (STIs). STIs include gonorrhea, chlamydia, syphilis, trichomonas, herpes, HPV, and human immunodeficiency virus (HIV). Herpes, HIV, and HPV are viral illnesses that have no cure. They can result in disability, cancer, and death.  A one-time screening for abdominal aortic aneurysm (AAA) and surgical repair of large AAAs by sound wave imaging (ultrasonography) is recommended for ages 80 to 41 years who are current or former smokers.  Healthy men should no longer receive prostate-specific antigen (PSA) blood tests as part of routine cancer screening. Consult with your caregiver about prostate cancer screening.  Testicular cancer screening is not recommended for adult males who have no symptoms. Screening includes self-exam, caregiver exam, and other screening tests. Consult with your caregiver about any symptoms you have or any concerns you have about testicular cancer.  Use  sunscreen with skin protection factor (SPF) of 30 or more. Apply sunscreen liberally and repeatedly throughout the day. You should seek shade when your shadow is shorter than you. Protect yourself by wearing long sleeves, pants, a wide-brimmed hat, and sunglasses year round, whenever you are outdoors.  Once a month, do a whole body skin exam, using a mirror to look at the skin on your back. Notify your caregiver of new moles, moles that have irregular borders, moles that are larger than a pencil eraser, or moles that have changed in shape or color.  Stay current with required immunizations.  Influenza. You need a dose every fall (or winter). The composition of the flu vaccine changes each year, so being vaccinated once is not enough.  Pneumococcal polysaccharide. You need 1 to 2 doses if you smoke cigarettes or if you have certain chronic medical conditions. You need 1 dose at age 86 (or older) if you have never been vaccinated.  Tetanus, diphtheria, pertussis (Tdap, Td). Get 1 dose of Tdap vaccine if you are younger than age 23 years, are over 53 and have contact with an infant, are a Research scientist (physical sciences), or simply want to be protected from whooping cough. After that, you need a Td booster dose every 10 years. Consult your caregiver if you have not had at least 3 tetanus and diphtheria-containing shots sometime in your life or have a deep or dirty wound.  HPV. This vaccine is recommended for males 13 through 49 years of age. This vaccine may be given to men 22 through 49 years of age who have not completed the 3 dose series. It is recommended for men through age 76 who have sex with men or whose immune system is weakened because of HIV infection, other illness, or medications. The vaccine is given in 3 doses over 6 months.  Measles, mumps, rubella (MMR). You need at least 1 dose of MMR if you were born in 1957 or later. You may also need a 2nd dose.  Meningococcal. If you are age 82 to 58 years and a  Orthoptist living in a residence hall, or have one of several medical conditions, you need to get vaccinated against meningococcal disease. You may also need additional booster doses.  Zoster (shingles). If you are age 38 years or older, you should get this vaccine.  Varicella (chickenpox). If you have never had chickenpox or you were vaccinated but received only 1 dose, talk to your caregiver to find out if you need this vaccine.  Hepatitis A. You need this vaccine if you have a specific risk factor for hepatitis A virus infection, or you simply wish  to be protected from this disease. The vaccine is usually given as 2 doses, 6 to 18 months apart.  Hepatitis B. You need this vaccine if you have a specific risk factor for hepatitis B virus infection or you simply wish to be protected from this disease. The vaccine is given in 3 doses, usually over 6 months. Preventative Service / Frequency Ages 74 to 50  Blood pressure check.** / Every 1 to 2 years.  Lipid and cholesterol check.** / Every 5 years beginning at age 48.  Hepatitis C blood test.** / For any individual with known risks for hepatitis C.  Skin self-exam. / Monthly.  Influenza immunization.** / Every year.  Pneumococcal polysaccharide immunization.** / 1 to 2 doses if you smoke cigarettes or if you have certain chronic medical conditions.  Tetanus, diphtheria, pertussis (Tdap,Td) immunization. / A one-time dose of Tdap vaccine. After that, you need a Td booster dose every 10 years.  HPV immunization. / 3 doses over 6 months, if 26 and younger.  Measles, mumps, rubella (MMR) immunization. / You need at least 1 dose of MMR if you were born in 1957 or later. You may also need a 2nd dose.  Meningococcal immunization. / 1 dose if you are age 47 to 59 years and a Orthoptist living in a residence hall, or have one of several medical conditions, you need to get vaccinated against meningococcal disease.  You may also need additional booster doses.  Varicella immunization.** / Consult your caregiver.  Hepatitis A immunization.** / Consult your caregiver. 2 doses, 6 to 18 months apart.  Hepatitis B immunization.** / Consult your caregiver. 3 doses usually over 6 months. Ages 55 to 45  Blood pressure check.** / Every 1 to 2 years.  Lipid and cholesterol check.** / Every 5 years beginning at age 37.  Fecal occult blood test (FOBT) of stool. / Every year beginning at age 25 and continuing until age 86. You may not have to do this test if you get colonoscopy every 10 years.  Flexible sigmoidoscopy** or colonoscopy.** / Every 5 years for a flexible sigmoidoscopy or every 10 years for a colonoscopy beginning at age 20 and continuing until age 85.  Hepatitis C blood test.** / For all people born from [birth year] through 1967 and any individual with known risks for hepatitis C.  Skin self-exam. / Monthly.  Influenza immunization.** / Every year.  Pneumococcal polysaccharide immunization.** / 1 to 2 doses if you smoke cigarettes or if you have certain chronic medical conditions.  Tetanus, diphtheria, pertussis (Tdap/Td) immunization.** / A one-time dose of Tdap vaccine. After that, you need a Td booster dose every 10 years.  Measles, mumps, rubella (MMR) immunization. / You need at least 1 dose of MMR if you were born in 1957 or later. You may also need a 2nd dose.  Varicella immunization.**/ Consult your caregiver.  Meningococcal immunization.** / Consult your caregiver.  Hepatitis A immunization.** / Consult your caregiver. 2 doses, 6 to 18 months apart.  Hepatitis B immunization.** / Consult your caregiver. 3 doses, usually over 6 months. Ages 26 and over  Blood pressure check.** / Every 1 to 2 years.  Lipid and cholesterol check.**/ Every 5 years beginning at age 14.  Fecal occult blood test (FOBT) of stool. / Every year beginning at age 61 and continuing until age 27. You may not have  to do this test if you get colonoscopy every 10 years.  Flexible sigmoidoscopy** or colonoscopy.** / Every 5 years  for a flexible sigmoidoscopy or every 10 years for a colonoscopy beginning at age 42 and continuing until age 65.  Hepatitis C blood test.** / For all people born from [birth year] through 1967 and any individual with known risks for hepatitis C.  Abdominal aortic aneurysm (AAA) screening.** / A one-time screening for ages 58 to 42 years who are current or former smokers.  Skin self-exam. / Monthly.  Influenza immunization.** / Every year.  Pneumococcal polysaccharide immunization.** / 1 dose at age 60 (or older) if you have never been vaccinated.  Tetanus, diphtheria, pertussis (Tdap, Td) immunization. / A one-time dose of Tdap vaccine if you are over 65 and have contact with an infant, are a Research scientist (physical sciences), or simply want to be protected from whooping cough. After that, you need a Td booster dose every 10 years.  Varicella immunization. ** / Consult your caregiver.  Meningococcal immunization.** / Consult your caregiver.  Hepatitis A immunization. ** / Consult your caregiver. 2 doses, 6 to 18 months apart.  Hepatitis B immunization.** / Check with your caregiver. 3 doses, usually over 6 months. **Family history and personal history of risk and conditions may change your caregiver's recommendations. Document Released: 09/02/2001 Document Revised: 09/29/2011 Document Reviewed: 12/02/2010 [facility] Patient Information 2013 [place], [place].

## 2012-09-07 NOTE — Assessment & Plan Note (Signed)
improved

## 2012-09-07 NOTE — Assessment & Plan Note (Signed)
Mild elevation, avoid trans fats, increase exercise, minimize simple carbs and saturated fats. Start krill oil caps daily

## 2012-09-07 NOTE — Assessment & Plan Note (Signed)
Doing well encouraged heart healthy diet and increased exercise. Moderate alcohol and no tobacco is encouraged.

## 2012-09-07 NOTE — Assessment & Plan Note (Signed)
Base of right 2 nd toe, likely early hammer toe encouraged gel inserts for now report worsening symptoms

## 2012-09-07 NOTE — Assessment & Plan Note (Signed)
Elevated TSH, will increase Levothyroxine dose to 100 mcg po daily and recheck TSH level to 12 weeks

## 2012-09-07 NOTE — Assessment & Plan Note (Signed)
No pain or difficulty since repair of anal fistual and redo. Encouraged probiotics, fiber, fluids, hi fiber diet. If symptoms return will need to proceed with early colonoscopy

## 2012-09-07 NOTE — Progress Notes (Signed)
Patient ID: Joel Owen, male   DOB: 06/18/1966, 47 y.o.   MRN: [id number] Joel Owen [id number] May 14, 1966 09/07/2012      Progress Note New Patient  Subjective  Chief Complaint  Chief Complaint  Patient presents with  . Annual Exam    physical    HPI  Patient is a 47 year old Caucasian male in today for annual exam. Overall his health is good. He struggles with some right elbow pain. He's been trying to exercise more and notes with repetitive use his medial elbow is painful. He's tried some splints and this is marginally helpful. NSAIDs are also marginally helpful. Has been going on off and on for over a month. Does have some pain also at the base of the second toe on the right foot that also comes and goes as far has not responded to orthotics. No redness or warmth. Was evaluated for gout this was negative. Otherwise no recent illness. No fevers or chills. No chest pain, shortness of breath, GI or GU noted at this time  Past Medical History  Diagnosis Date  . Thyroid disease     hypothyroidism  . BPH (benign prostatic hyperplasia)   . Insomnia   . Adjustment disorder     with depressed mood  . Rectal abscess     history of, s/p surgery  . Epicondylitis 09/07/2012    Past Surgical History  Procedure Laterality Date  . Tonsillectomy      1971  . Carpal tunnel release  02/07/2011    Dr  Sylvester Harder    Family History  Problem Relation Age of Onset  . Hypertension Mother   . Hyperlipidemia Mother   . Gout Father   . Arthritis Father   . Diverticulitis Sister   . Cholelithiasis Sister   . Heart disease Maternal Grandfather     History   Social History  . Marital Status: Legally Separated    Spouse Name: N/A    Number of Children: 0  . 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 Smoking status: Former Games developer  . Smokeless tobacco: Former Neurosurgeon  . Alcohol Use: 6.0 oz/week    10 Cans of beer per  week  . Drug Use: No  . Sexually Active: Not on file   Other Topics Concern  . Not on file   Social History Narrative   Pt is divorced.               Current Outpatient Prescriptions on File Prior to Visit  Medication Sig Dispense Refill  . aspirin 81 MG tablet Take 81 mg by mouth daily.      . fluticasone (FLONASE) 50 MCG/ACT nasal spray Place 2 sprays into the nose daily.  16 g  6  . Red Yeast Rice Extract (RED YEAST RICE PO) Take by mouth every other day.        No current facility-administered medications on file prior to visit.    Allergies  Allergen Reactions  . Codeine     itching    Review of Systems  Review of Systems  Constitutional: Negative for fever, chills and malaise/fatigue.  HENT: Negative for hearing loss, nosebleeds and congestion.   Eyes: Negative for discharge.  Respiratory: Negative for cough, sputum production, shortness of breath and wheezing.   Cardiovascular: Negative for chest pain, palpitations and leg swelling.  Gastrointestinal: Negative for heartburn, nausea, vomiting, abdominal pain, diarrhea, constipation  and blood in stool.  Genitourinary: Negative for dysuria, urgency, frequency and hematuria.  Musculoskeletal: Negative for myalgias, back pain and falls.  Skin: Negative for rash.  Neurological: Negative for dizziness, tremors, sensory change, focal weakness, loss of consciousness, weakness and headaches.  Endo/Heme/Allergies: Negative for polydipsia. Does not bruise/bleed easily.  Psychiatric/Behavioral: Negative for depression and suicidal ideas. The patient is not nervous/anxious and does not have insomnia.     Objective  BP 110/84  Pulse 62  Temp(Src) 98 F (36.7 C) (Oral)  Ht 6' (1.829 m)  Wt 201 lb (91.173 kg)  BMI 27.25 kg/m2  SpO2 97%  Physical Exam  Physical Exam  Constitutional: He is oriented to person, place, and time and well-developed, well-nourished, and in no distress. No distress.  HENT:  Head:  Normocephalic and atraumatic.  Eyes: Conjunctivae are normal.  Neck: Neck supple. No thyromegaly present.  Cardiovascular: Normal rate, regular rhythm and normal heart sounds.   No murmur heard. Pulmonary/Chest: Effort normal and breath sounds normal. No respiratory distress.  Abdominal: He exhibits no distension and no mass. There is no tenderness.  Musculoskeletal: He exhibits no edema.  Neurological: He is alert and oriented to person, place, and time.  Skin: Skin is warm.  Psychiatric: Memory, affect and judgment normal.       Assessment & Plan  RECTAL PAIN No pain or difficulty since repair of anal fistual and redo. Encouraged probiotics, fiber, fluids, hi fiber diet. If symptoms return will need to proceed with early colonoscopy  HYPOTHYROIDISM Elevated TSH, will increase Levothyroxine dose to 100 mcg po daily and recheck TSH level to 12 weeks  Erectile dysfunction Refill given on Viagra.   HYPERLIPIDEMIA, MILD Mild elevation, avoid trans fats, increase exercise, minimize simple carbs and saturated fats. Start krill oil caps daily  Epicondylitis Ice and aspercreme. If pain persists then consider short course of physical therapy.   INSOMNIA, CHRONIC improved  Routine general medical examination at a health care facility Doing well encouraged heart healthy diet and increased exercise. Moderate alcohol and no tobacco is encouraged.  Foot pain, right Base of right 2 nd toe, likely early hammer toe encouraged gel inserts for now report worsening symptoms

## 2012-09-07 NOTE — Assessment & Plan Note (Signed)
Refill given on Viagra.

## 2012-09-14 ENCOUNTER — Other Ambulatory Visit: Payer: Self-pay | Admitting: Family Medicine

## 2012-09-14 ENCOUNTER — Telehealth: Payer: Self-pay | Admitting: Internal Medicine

## 2012-09-14 DIAGNOSIS — M79671 Pain in right foot: Secondary | ICD-10-CM

## 2012-09-14 NOTE — Telephone Encounter (Signed)
Referral placed.

## 2012-09-14 NOTE — Telephone Encounter (Signed)
Patient was in last week and Dr Abner Greenspan said if he wanted to go to the foot doctor she would put in a referral for him.  He called back and wants to see a foot doctor in [place]

## 2012-09-14 NOTE — Telephone Encounter (Signed)
Please advise 

## 2012-11-17 ENCOUNTER — Telehealth: Payer: Self-pay | Admitting: *Deleted

## 2012-11-17 DIAGNOSIS — E039 Hypothyroidism, unspecified: Secondary | ICD-10-CM

## 2012-11-17 LAB — TSH: TSH: 2.896 u[IU]/mL (ref 0.350–4.500)

## 2012-11-17 NOTE — Telephone Encounter (Signed)
Pt presented to the lab. Order entered per 09/07/12 office note.

## 2013-01-25 ENCOUNTER — Other Ambulatory Visit: Payer: Self-pay | Admitting: Internal Medicine

## 2013-01-25 NOTE — Telephone Encounter (Signed)
Rx request to pharmacy/[facility]  

## 2013-02-23 ENCOUNTER — Telehealth: Payer: Self-pay

## 2013-02-23 DIAGNOSIS — E785 Hyperlipidemia, unspecified: Secondary | ICD-10-CM

## 2013-02-23 DIAGNOSIS — E039 Hypothyroidism, unspecified: Secondary | ICD-10-CM

## 2013-02-23 DIAGNOSIS — Z0000 Encounter for general adult medical examination without abnormal findings: Secondary | ICD-10-CM

## 2013-02-23 LAB — RENAL FUNCTION PANEL
Albumin: 4.4 g/dL (ref 3.5–5.2)
CO2: 25 mEq/L (ref 19–32)
Chloride: 104 mEq/L (ref 96–112)
Glucose, Bld: 91 mg/dL (ref 70–99)
Potassium: 4.5 mEq/L (ref 3.5–5.3)
Sodium: 139 mEq/L (ref 135–145)

## 2013-02-23 LAB — TSH: TSH: 4.444 u[IU]/mL (ref 0.350–4.500)

## 2013-02-23 LAB — CBC
Hemoglobin: 14 g/dL (ref 13.0–17.0)
MCV: 92 fL (ref 78.0–100.0)
Platelets: 245 10*3/uL (ref 150–400)
RBC: 4.38 MIL/uL (ref 4.22–5.81)
WBC: 5.8 10*3/uL (ref 4.0–10.5)

## 2013-02-23 LAB — HEPATIC FUNCTION PANEL
AST: 23 U/L (ref 0–37)
Albumin: 4.4 g/dL (ref 3.5–5.2)
Alkaline Phosphatase: 41 U/L (ref 39–117)
Total Protein: 6.6 g/dL (ref 6.0–8.3)

## 2013-02-23 LAB — LIPID PANEL
HDL: 38 mg/dL — ABNORMAL LOW (ref >39)
LDL Cholesterol: 117 mg/dL — ABNORMAL HIGH (ref 0–99)

## 2013-02-23 NOTE — Telephone Encounter (Signed)
Lab order placed.

## 2013-02-28 ENCOUNTER — Encounter: Payer: Self-pay | Admitting: Family Medicine

## 2013-02-28 ENCOUNTER — Ambulatory Visit (INDEPENDENT_AMBULATORY_CARE_PROVIDER_SITE_OTHER): Payer: 59 | Admitting: Family Medicine

## 2013-02-28 VITALS — BP 120/76 | HR 70 | Temp 97.7°F | Ht 72.0 in | Wt 205.0 lb

## 2013-02-28 DIAGNOSIS — N529 Male erectile dysfunction, unspecified: Secondary | ICD-10-CM

## 2013-02-28 DIAGNOSIS — E039 Hypothyroidism, unspecified: Secondary | ICD-10-CM

## 2013-02-28 DIAGNOSIS — M79609 Pain in unspecified limb: Secondary | ICD-10-CM

## 2013-02-28 DIAGNOSIS — E785 Hyperlipidemia, unspecified: Secondary | ICD-10-CM

## 2013-02-28 DIAGNOSIS — M79671 Pain in right foot: Secondary | ICD-10-CM

## 2013-02-28 MED ORDER — SILDENAFIL CITRATE 100 MG PO TABS: 100.0000 mg | ORAL_TABLET | Freq: Every day | ORAL | Status: DC | PRN

## 2013-02-28 NOTE — Patient Instructions (Addendum)
Vitamin B complex daily   Cholesterol Cholesterol is a white, waxy, fat-like protein needed by your body in small amounts. The liver makes all the cholesterol you need. It is carried from the liver by the blood through the blood vessels. Deposits (plaque) may build up on blood vessel walls. This makes the arteries narrower and stiffer. Plaque increases the risk for heart attack and stroke. You cannot feel your cholesterol level even if it is very high. The only way to know is by a blood test to check your lipid (fats) levels. Once you know your cholesterol levels, you should keep a record of the test results. Work with your caregiver to to keep your levels in the desired range. WHAT THE RESULTS MEAN:  Total cholesterol is a rough measure of all the cholesterol in your blood.  LDL is the so-called bad cholesterol. This is the type that deposits cholesterol in the walls of the arteries. You want this level to be low.  HDL is the good cholesterol because it cleans the arteries and carries the LDL away. You want this level to be high.  Triglycerides are fat that the body can either burn for energy or store. High levels are closely linked to heart disease. DESIRED LEVELS:  Total cholesterol below 200.  LDL below 100 for people at risk, below 70 for very high risk.  HDL above 50 is good, above 60 is best.  Triglycerides below 150. HOW TO LOWER YOUR CHOLESTEROL:  Diet.  Choose fish or white meat chicken and [place], roasted or baked. Limit fatty cuts of red meat, fried foods, and processed meats, such as sausage and lunch meat.  Eat lots of fresh fruits and vegetables. Choose whole grains, beans, pasta, potatoes and cereals.  Use only small amounts of olive, corn or canola oils. Avoid butter, mayonnaise, shortening or palm kernel oils. Avoid foods with trans-fats.  Use skim/nonfat milk and low-fat/nonfat yogurt and cheeses. Avoid whole milk, cream, ice cream, egg yolks and cheeses. Healthy  desserts include angel food cake, ginger snaps, animal crackers, hard candy, popsicles, and low-fat/nonfat frozen yogurt. Avoid pastries, cakes, pies and cookies.  Exercise.  A regular program helps decrease LDL and raises HDL.  Helps with weight control.  Do things that increase your activity level like gardening, walking, or taking the stairs.  Medication.  May be prescribed by your caregiver to help lowering cholesterol and the risk for heart disease.  You may need medicine even if your levels are normal if you have several risk factors. HOME CARE INSTRUCTIONS   Follow your diet and exercise programs as suggested by your caregiver.  Take medications as directed.  Have blood work done when your caregiver feels it is necessary. MAKE SURE YOU:   Understand these instructions.  Will watch your condition.  Will get help right away if you are not doing well or get worse. Document Released: 04/01/2001 Document Revised: 09/29/2011 Document Reviewed: 09/22/2007 [facility] Patient Information 2014 [place], [place].

## 2013-02-28 NOTE — Assessment & Plan Note (Signed)
Avoid trans fats, add a vitamin B complex, continue current supplements and exercise

## 2013-02-28 NOTE — Progress Notes (Signed)
Patient ID: Joel Owen, male   DOB: 1966/03/07, 47 y.o.   MRN: [id number] RAYJON WERY [id number] 04-14-1966 02/28/2013      Progress Note-Follow Up  Subjective  Chief Complaint  Chief Complaint  Patient presents with  . Follow-up    on labs    HPI  Patient is a 47 year old Caucasian male who is in today for followup on his labs. Overall she's feeling well although he is working excessive hours. Has been under great deal of stress. Has managed to keep exercising but is been working night shift recently. Denies recent illness. No chest pain or palpitations. No shortness or breath GI or GU complaints. No dietary changes since his last visit.  Past Medical History  Diagnosis Date  . Thyroid disease     hypothyroidism  . BPH (benign prostatic hyperplasia)   . Insomnia   . Adjustment disorder     with depressed mood  . Rectal abscess     history of, s/p surgery  . Epicondylitis 09/07/2012    Past Surgical History  Procedure Laterality Date  . Tonsillectomy      1971  . Carpal tunnel release  02/07/2011    Dr  Sylvester Harder    Family History  Problem Relation Age of Onset  . Hypertension Mother   . Hyperlipidemia Mother   . Gout Father   . Arthritis Father   . Diverticulitis Sister   . Cholelithiasis Sister   . Heart disease Maternal Grandfather     History   Social History  . Marital Status: Legally Separated    Spouse Name: N/A    Number of Children: 0  . 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 Smoking status: Former Games developer  . Smokeless tobacco: Former Neurosurgeon  . Alcohol Use: 6.0 oz/week    10 Cans of beer per week  . Drug Use: No  . Sexually Active: Not on file   Other Topics Concern  . Not on file   Social History Narrative   Pt is divorced.               Current Outpatient Prescriptions on File Prior to Visit  Medication Sig Dispense Refill  . aspirin 81 MG tablet Take 81 mg  by mouth daily.      . fluticasone (FLONASE) 50 MCG/ACT nasal spray PLACE 2 SPRAYS INTO THE NOSE DAILY.  16 g  5  . Krill Oil CAPS 1 cap daily such as MegaRed daily      . levothyroxine (SYNTHROID, LEVOTHROID) 100 MCG tablet Take 1 tablet (100 mcg total) by mouth daily.  30 tablet  5  . Red Yeast Rice Extract (RED YEAST RICE PO) Take by mouth every other day.        No current facility-administered medications on file prior to visit.    Allergies  Allergen Reactions  . Codeine     itching    Review of Systems  Review of Systems  Constitutional: Negative for fever and malaise/fatigue.  HENT: Negative for congestion.   Eyes: Negative for pain and discharge.  Respiratory: Negative for shortness of breath.   Cardiovascular: Negative for chest pain, palpitations and leg swelling.  Gastrointestinal: Negative for nausea, abdominal pain and diarrhea.  Genitourinary: Negative for dysuria.  Musculoskeletal: Negative for falls.  Skin: Negative for rash.  Neurological: Negative for loss of consciousness and headaches.  Endo/Heme/Allergies: Negative for polydipsia.  Psychiatric/Behavioral: Negative for depression and suicidal ideas. The patient is not nervous/anxious and does not have insomnia.     Objective  BP 120/76  Pulse 70  Temp(Src) 97.7 F (36.5 C) (Oral)  Ht 6' (1.829 m)  Wt 205 lb (92.987 kg)  BMI 27.8 kg/m2  SpO2 98%  Physical Exam  Physical Exam  Constitutional: He is oriented to person, place, and time and well-developed, well-nourished, and in no distress. No distress.  HENT:  Head: Normocephalic and atraumatic.  Eyes: Conjunctivae are normal.  Neck: Neck supple. No thyromegaly present.  Cardiovascular: Normal rate, regular rhythm and normal heart sounds.   No murmur heard. Pulmonary/Chest: Effort normal and breath sounds normal. No respiratory distress.  Abdominal: He exhibits no distension and no mass. There is no tenderness.  Musculoskeletal: He exhibits no  edema.  Neurological: He is alert and oriented to person, place, and time.  Skin: Skin is warm.  Psychiatric: Memory, affect and judgment normal.    Lab Results  Component Value Date   TSH 4.444 02/23/2013   Lab Results  Component Value Date   WBC 5.8 02/23/2013   HGB 14.0 02/23/2013   HCT 40.3 02/23/2013   MCV 92.0 02/23/2013   PLT 245 02/23/2013   Lab Results  Component Value Date   CREATININE 1.28 02/23/2013   BUN 14 02/23/2013   NA 139 02/23/2013   K 4.5 02/23/2013   CL 104 02/23/2013   CO2 25 02/23/2013   Lab Results  Component Value Date   ALT 18 02/23/2013   AST 23 02/23/2013   ALKPHOS 41 02/23/2013   BILITOT 0.7 02/23/2013   Lab Results  Component Value Date   CHOL 191 02/23/2013   Lab Results  Component Value Date   HDL 38* 02/23/2013   Lab Results  Component Value Date   LDLCALC 117* 02/23/2013   Lab Results  Component Value Date   TRIG 181* 02/23/2013   Lab Results  Component Value Date   CHOLHDL 5.0 02/23/2013     Assessment & Plan  HYPOTHYROIDISM TSH trending up but still acceptable will recheck in 3 months and change Levothyroxine if needed  HYPERLIPIDEMIA, MILD Avoid trans fats, add a vitamin B complex, continue current supplements and exercise  Foot pain, right Doing better with orthotics from podiatry.

## 2013-02-28 NOTE — Assessment & Plan Note (Signed)
TSH trending up but still acceptable will recheck in 3 months and change Levothyroxine if needed

## 2013-02-28 NOTE — Assessment & Plan Note (Signed)
Doing better with orthotics from podiatry.

## 2013-03-01 ENCOUNTER — Other Ambulatory Visit: Payer: Self-pay | Admitting: Family Medicine

## 2013-03-08 ENCOUNTER — Ambulatory Visit: Payer: 59 | Admitting: Family Medicine

## 2013-05-25 ENCOUNTER — Telehealth: Payer: Self-pay | Admitting: *Deleted

## 2013-05-25 DIAGNOSIS — E785 Hyperlipidemia, unspecified: Secondary | ICD-10-CM

## 2013-05-25 DIAGNOSIS — Z0000 Encounter for general adult medical examination without abnormal findings: Secondary | ICD-10-CM

## 2013-05-25 NOTE — Telephone Encounter (Signed)
Pt presented to the lab. Per last office note it looks like pt is going to need a TSH.  ARe there any other labs that need to be ordered?

## 2013-05-25 NOTE — Telephone Encounter (Signed)
Can have Tsh, lipid, renal, cbc, hepatic

## 2013-05-26 ENCOUNTER — Other Ambulatory Visit: Payer: Self-pay

## 2013-05-26 LAB — CBC
HCT: 40.7 % (ref 39.0–52.0)
MCV: 93.1 fL (ref 78.0–100.0)
RBC: 4.37 MIL/uL (ref 4.22–5.81)
WBC: 5.4 10*3/uL (ref 4.0–10.5)

## 2013-05-27 LAB — RENAL FUNCTION PANEL
Albumin: 4.4 g/dL (ref 3.5–5.2)
BUN: 13 mg/dL (ref 6–23)
CO2: 24 mEq/L (ref 19–32)
Calcium: 9.7 mg/dL (ref 8.4–10.5)
Chloride: 101 mEq/L (ref 96–112)
Creat: 1.2 mg/dL (ref 0.50–1.35)
Glucose, Bld: 89 mg/dL (ref 70–99)
Phosphorus: 3.5 mg/dL (ref 2.3–4.6)
Potassium: 4.2 mEq/L (ref 3.5–5.3)
Sodium: 137 mEq/L (ref 135–145)

## 2013-05-27 LAB — HEPATIC FUNCTION PANEL
ALT: 20 U/L (ref 0–53)
AST: 23 U/L (ref 0–37)
Albumin: 4.4 g/dL (ref 3.5–5.2)
Alkaline Phosphatase: 45 U/L (ref 39–117)
Bilirubin, Direct: 0.1 mg/dL (ref 0.0–0.3)
Indirect Bilirubin: 0.4 mg/dL (ref 0.0–0.9)
Total Bilirubin: 0.5 mg/dL (ref 0.3–1.2)
Total Protein: 6.9 g/dL (ref 6.0–8.3)

## 2013-05-27 LAB — LIPID PANEL
LDL Cholesterol: 132 mg/dL — ABNORMAL HIGH (ref 0–99)
Triglycerides: 125 mg/dL (ref <150)

## 2013-06-01 ENCOUNTER — Telehealth: Payer: Self-pay

## 2013-06-01 ENCOUNTER — Encounter: Payer: Self-pay | Admitting: Physician Assistant

## 2013-06-01 ENCOUNTER — Ambulatory Visit (INDEPENDENT_AMBULATORY_CARE_PROVIDER_SITE_OTHER): Payer: 59 | Admitting: Physician Assistant

## 2013-06-01 VITALS — BP 120/84 | HR 74 | Temp 98.2°F | Ht 72.0 in | Wt 202.0 lb

## 2013-06-01 DIAGNOSIS — R309 Painful micturition, unspecified: Secondary | ICD-10-CM

## 2013-06-01 DIAGNOSIS — R10819 Abdominal tenderness, unspecified site: Secondary | ICD-10-CM

## 2013-06-01 DIAGNOSIS — N23 Unspecified renal colic: Secondary | ICD-10-CM

## 2013-06-01 LAB — POCT URINALYSIS DIPSTICK
Bilirubin, UA: NEGATIVE
Blood, UA: NEGATIVE
Glucose, UA: NEGATIVE
Leukocytes, UA: NEGATIVE
Spec Grav, UA: 1.01
Urobilinogen, UA: 0.2
pH, UA: 6

## 2013-06-01 LAB — URINALYSIS, MICROSCOPIC ONLY
Bacteria, UA: NONE SEEN
Casts: NONE SEEN

## 2013-06-01 NOTE — Progress Notes (Signed)
Pre visit review using our clinic review tool, if applicable. No additional management support is needed unless otherwise documented below in the visit note. 

## 2013-06-01 NOTE — Patient Instructions (Signed)
Please avoid overuse of abdominal muscles at the gym.  Ibuprofen as needed for pain.  I will call you with your results.  If symptoms are not improving over the next day, please call or return to clinic so we can proceed with a further workup.

## 2013-06-01 NOTE — Telephone Encounter (Signed)
Message copied by Joel Owen on Wed Jun 01, 2013  5:22 PM ------      Message from: [name]      Created: Wed Jun 01, 2013  3:35 PM       Please inform patient that his urine showed no signs of bacteria under the microscope.  Will continue care as discussed earlier.  He is to return to clinic if symptoms persist or worsen. ------

## 2013-06-01 NOTE — Progress Notes (Signed)
Patient ID: Joel Owen, male   DOB: May 18, 1966, 47 y.o.   MRN: [id number]  Patient presents to clinic today c/o mild suprapubic pain that is worse with abdominal strain x 1 day.  Patient states he first noticed pain last night at work.  Pain is throbbing and pulling in nature, located in the suprapubic region.  Patient denies tenderness elsewhere in the abdomen.  Denies penile or testicular pain or mass.  Denies heavy lifting, but does endorse going to the gym daily.  Denies dysuria, hematuria, flank pain.  Has noted some urinary urgency.  Denies recent intercourse or concern for STD.  Denies penile discharge.  Patient with history of BPH, denies hesitancy or weak stream.  Patient with recent labs including CBC, BMP and LFTs all within normal limits.  Past Medical History  Diagnosis Date  . Thyroid disease     hypothyroidism  . BPH (benign prostatic hyperplasia)   . Insomnia   . Adjustment disorder     with depressed mood  . Rectal abscess     history of, s/p surgery  . 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 aspirin 81 MG tablet Take 81 mg by mouth daily.      . fluticasone (FLONASE) 50 MCG/ACT nasal spray PLACE 2 SPRAYS INTO THE NOSE DAILY.  16 g  5  . Krill Oil CAPS 1 cap daily such as MegaRed daily      . levothyroxine (SYNTHROID, LEVOTHROID) 100 MCG tablet Take 1 tablet (100 mcg total) by mouth daily.  30 tablet  5  . Red Yeast Rice Extract (RED YEAST RICE PO) Take by mouth every other day.       . sildenafil (VIAGRA) 100 MG tablet Take 1 tablet (100 mg total) by mouth daily as needed.  10 tablet  6   No current facility-administered medications on file prior to visit.    Allergies  Allergen Reactions  . Codeine     itching    Family History  Problem Relation Age of Onset  . Hypertension Mother   . Hyperlipidemia Mother   . Gout Father   . Arthritis Father   . Diverticulitis Sister   .  Cholelithiasis Sister   . Heart disease Maternal Grandfather     History   Social History  . Marital Status: Legally Separated    Spouse Name: N/A    Number of Children: 0  . 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 Smoking status: Former Games developer  . Smokeless tobacco: Former Neurosurgeon  . Alcohol Use: 6.0 oz/week    10 Cans of beer per week  . Drug Use: No  . Sexual Activity: None   Other Topics Concern  . None   Social History Narrative   Pt is divorced.              ROS See HPI.  All other ROS are negative  Filed Vitals:   06/01/13 0904  BP: 120/84  Pulse: 74  Temp: 98.2 F (36.8 C)    Physical Exam  Vitals reviewed. Constitutional: He is oriented to person, place, and time and well-developed, well-nourished, and in no distress.  HENT:  Head: Normocephalic and atraumatic.  Eyes: Conjunctivae are normal.  Neck: Neck supple.  Cardiovascular: Normal rate, regular rhythm and normal heart sounds.   Pulmonary/Chest: Effort normal and breath  sounds normal. No respiratory distress. He has no wheezes. He has no rales. He exhibits no tenderness.  Abdominal: Soft. Bowel sounds are normal. He exhibits no distension and no mass. There is no rebound and no guarding.  Mild suprapubic tenderness noted on exam  Genitourinary: He exhibits no abnormal testicular mass, no testicular tenderness, no abnormal scrotal mass, no scrotal tenderness and no epididymal tenderness. Penis exhibits no lesions and no edema. No discharge found.  Musculoskeletal: Normal range of motion. He exhibits no edema and no tenderness.  Lymphadenopathy:    He has no cervical adenopathy.  Neurological: He is alert and oriented to person, place, and time.  Skin: Skin is warm and dry. No rash noted.  Psychiatric: Affect normal.   Recent Results (from the past 2160 hour(s))  TSH     Status: None   Collection Time    05/26/13  4:43 PM       Result Value Range   TSH 3.827  0.350 - 4.500 uIU/mL  LIPID PANEL     Status: Abnormal   Collection Time    05/26/13  4:43 PM      Result Value Range   Cholesterol 200  0 - 200 mg/dL   Comment: ATP III Classification:           < 200        mg/dL        Desirable          200 - 239     mg/dL        Borderline High          >= 240        mg/dL        High         Triglycerides 125  <150 mg/dL   HDL 43  >16 mg/dL   Total CHOL/HDL Ratio 4.7     VLDL 25  0 - 40 mg/dL   LDL Cholesterol 109 (*) 0 - 99 mg/dL   Comment:       Total Cholesterol/HDL Ratio:CHD Risk                            Coronary Heart Disease Risk Table                                            Men       Women              1/2 Average Risk              3.4        3.3                  Average Risk              5.0        4.4               2X Average Risk              9.6        7.1               3X Average Risk             23.4       11.0     Use the  calculated Patient Ratio above and the CHD Risk table      to determine the patient's CHD Risk.     ATP III Classification (LDL):           < 100        mg/dL         Optimal          100 - 129     mg/dL         Near or Above Optimal          130 - 159     mg/dL         Borderline High          160 - 189     mg/dL         High           > 190        mg/dL         Very High        RENAL FUNCTION PANEL     Status: None   Collection Time    05/26/13  4:43 PM      Result Value Range   Sodium 137  135 - 145 mEq/L   Potassium 4.2  3.5 - 5.3 mEq/L   Chloride 101  96 - 112 mEq/L   CO2 24  19 - 32 mEq/L   Glucose, Bld 89  70 - 99 mg/dL   BUN 13  6 - 23 mg/dL   Creat 1.61  0.96 - 0.45 mg/dL   Albumin 4.4  3.5 - 5.2 g/dL   Calcium 9.7  8.4 - 40.9 mg/dL   Phosphorus 3.5  2.3 - 4.6 mg/dL  CBC     Status: None   Collection Time    05/26/13  4:43 PM      Result Value Range   WBC 5.4  4.0 - 10.5 K/uL   RBC 4.37  4.22 - 5.81 MIL/uL   Hemoglobin 13.8  13.0 - 17.0 g/dL    HCT 81.1  91.4 - 78.2 %   MCV 93.1  78.0 - 100.0 fL   MCH 31.6  26.0 - 34.0 pg   MCHC 33.9  30.0 - 36.0 g/dL   RDW 95.6  21.3 - 08.6 %   Platelets 275  150 - 400 K/uL  HEPATIC FUNCTION PANEL     Status: None   Collection Time    05/26/13  4:43 PM      Result Value Range   Total Bilirubin 0.5  0.3 - 1.2 mg/dL   Bilirubin, Direct 0.1  0.0 - 0.3 mg/dL   Indirect Bilirubin 0.4  0.0 - 0.9 mg/dL   Alkaline Phosphatase 45  39 - 117 U/L   AST 23  0 - 37 U/L   ALT 20  0 - 53 U/L   Total Protein 6.9  6.0 - 8.3 g/dL   Albumin 4.4  3.5 - 5.2 g/dL  POCT URINALYSIS DIPSTICK     Status: None   Collection Time    06/01/13 10:26 AM      Result Value Range   Color, UA Yellow     Clarity, UA Clear     Glucose, UA Negative     Bilirubin, UA Negative     Ketones, UA Negative     Spec Grav, UA 1.010     Blood, UA Negative     pH, UA 6.0     Protein, UA Negative  Urobilinogen, UA 0.2     Nitrite, UA Negative     Leukocytes, UA Negative      Assessment/Plan: Suprapubic tenderness Urine dipstick within normal limits.  Will send for microscopy.  Recent labs WNL.  Physical exam negative for hernia.  Tenderness possibly muscular in nature.  Ibuprofen or Aleve for pain.  Rest.  Return if symptoms not improving as we will need to obtain imaging.

## 2013-06-01 NOTE — Assessment & Plan Note (Signed)
Urine dipstick within normal limits.  Will send for microscopy.  Recent labs WNL.  Physical exam negative for hernia.  Tenderness possibly muscular in nature.  Ibuprofen or Aleve for pain.  Rest.  Return if symptoms not improving as we will need to obtain imaging.

## 2013-06-01 NOTE — Telephone Encounter (Signed)
Left patient voice mail about results. Instructed patient to call back if he had any questions or concerns.

## 2013-08-26 ENCOUNTER — Telehealth: Payer: Self-pay | Admitting: *Deleted

## 2013-08-26 DIAGNOSIS — E039 Hypothyroidism, unspecified: Secondary | ICD-10-CM

## 2013-08-26 LAB — TSH: TSH: 1.149 u[IU]/mL (ref 0.350–4.500)

## 2013-08-26 NOTE — Telephone Encounter (Signed)
Pt presented to the lab.  It looks like he needs a tsh. Is there anything else that I need to order?

## 2013-08-26 NOTE — Telephone Encounter (Signed)
That should be all he needs.

## 2013-08-26 NOTE — Telephone Encounter (Signed)
Order entered

## 2013-09-01 ENCOUNTER — Encounter: Payer: Self-pay | Admitting: Family Medicine

## 2013-09-01 ENCOUNTER — Ambulatory Visit (INDEPENDENT_AMBULATORY_CARE_PROVIDER_SITE_OTHER): Payer: 59 | Admitting: Family Medicine

## 2013-09-01 VITALS — BP 112/78 | HR 79 | Temp 98.2°F | Ht 72.0 in | Wt 196.1 lb

## 2013-09-01 DIAGNOSIS — Z1211 Encounter for screening for malignant neoplasm of colon: Secondary | ICD-10-CM

## 2013-09-01 DIAGNOSIS — E039 Hypothyroidism, unspecified: Secondary | ICD-10-CM

## 2013-09-01 DIAGNOSIS — E785 Hyperlipidemia, unspecified: Secondary | ICD-10-CM

## 2013-09-01 DIAGNOSIS — G4700 Insomnia, unspecified: Secondary | ICD-10-CM

## 2013-09-01 DIAGNOSIS — T7840XA Allergy, unspecified, initial encounter: Secondary | ICD-10-CM

## 2013-09-01 DIAGNOSIS — Z0000 Encounter for general adult medical examination without abnormal findings: Secondary | ICD-10-CM

## 2013-09-01 MED ORDER — ZOLPIDEM TARTRATE 10 MG PO TABS: 10.0000 mg | ORAL_TABLET | Freq: Every evening | ORAL | Status: DC | PRN

## 2013-09-01 MED ORDER — FLUTICASONE PROPIONATE 50 MCG/ACT NA SUSP
2.0000 | Freq: Every day | NASAL | Status: DC | PRN
Start: 2013-09-01 — End: 2014-09-21

## 2013-09-01 MED ORDER — LEVOTHYROXINE SODIUM 100 MCG PO TABS: 100.0000 ug | ORAL_TABLET | Freq: Every day | ORAL | Status: DC

## 2013-09-01 NOTE — Patient Instructions (Signed)
Soak in distilled white vinegar and warm water 15 minutes vick's vapor rub on toenails at night Consider Lavendar Oil to nails and Lamisil ointment daily Ringworm, Nail A fungal infection of the nail (tinea unguium/onychomycosis) is common. It is common as the visible part of the nail is composed of dead cells which have no blood supply to help prevent infection. It occurs because fungi are everywhere and will pick any opportunity to grow on any dead material. Because nails are very slow growing they require up to 2 years of treatment with anti-fungal medications. The entire nail back to the base is infected. This includes approximately  of the nail which you cannot see. If your caregiver has prescribed a medication by mouth, take it every day and as directed. No progress will be seen for at least 6 to 9 months. Do not be disappointed! Because fungi live on dead cells with little or no exposure to blood supply, medication delivery to the infection is slow; thus the cure is slow. It is also why you can observe no progress in the first 6 months. The nail becoming cured is the base of the nail, as it has the blood supply. Topical medication such as creams and ointments are usually not effective. Important in successful treatment of nail fungus is closely following the medication regimen that your doctor prescribes. Sometimes you and your caregiver may elect to speed up this process by surgical removal of all the nails. Even this may still require 6 to 9 months of additional oral medications. See your caregiver as directed. Remember there will be no visible improvement for at least 6 months. See your caregiver sooner if other signs of infection (redness and swelling) develop. Document Released: 07/04/2000 Document Revised: 09/29/2011 Document Reviewed: 09/12/2008 [employer] Patient Information 2014 [place], [place].

## 2013-09-01 NOTE — Progress Notes (Signed)
Pre visit review using our clinic review tool, if applicable. No additional management support is needed unless otherwise documented below in the visit note. 

## 2013-09-01 NOTE — Assessment & Plan Note (Signed)
Works night shift frequently, is having trouble adjusting his sleep. Has used Zolpidem in past works but sometimes only gets him 4 hours sleep at a time

## 2013-09-04 ENCOUNTER — Encounter: Payer: Self-pay | Admitting: Family Medicine

## 2013-09-04 DIAGNOSIS — T7840XA Allergy, unspecified, initial encounter: Secondary | ICD-10-CM

## 2013-09-04 HISTORY — DX: Allergy, unspecified, initial encounter: T78.40XA

## 2013-09-04 NOTE — Assessment & Plan Note (Signed)
Tolerating red yeast rice, avoid trans fats, increase exercise

## 2013-09-04 NOTE — Assessment & Plan Note (Signed)
Good response to current dose of Levothyroxine

## 2013-09-04 NOTE — Progress Notes (Signed)
Patient ID: Joel Owen, male   DOB: September 22, 1965, 48 y.o.   MRN: [id number] Joel Owen [id number] 08-May-1966 09/04/2013      Progress Note-Follow Up  Subjective  Chief Complaint  Chief Complaint  Patient presents with  . Annual Exam    physical    HPI  Is a 48 year old Caucasian male in today for annual exam. Generally feels well. No recent illness. Taking medications as prescribed. Denies any chest pain, palpitations or shortness of breath. No GI or GU complaints at this time.  Past Medical History  Diagnosis Date  . Thyroid disease     hypothyroidism  . BPH (benign prostatic hyperplasia)   . Insomnia   . Adjustment disorder     with depressed mood  . Rectal abscess     history of, s/p surgery  . 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 Allergic state 09/04/2013    Past Surgical History  Procedure Laterality Date  . Tonsillectomy      1971  . Carpal tunnel release  02/07/2011    Dr  Tyrone Sage    Family History  Problem Relation Age of Onset  . Hypertension Mother   . Hyperlipidemia Mother   . Gout Father   . Arthritis Father   . Diverticulitis Sister   . Cholelithiasis Sister   . Heart disease Maternal Grandfather     History   Social History  . Marital Status: Legally Separated    Spouse Name: N/A    Number of Children: 0  . 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 Smoking status: Former Research scientist (life sciences)  . Smokeless tobacco: Former Systems developer  . Alcohol Use: 6.0 oz/week    10 Cans of beer per week  . Drug Use: No  . Sexual Activity: Not on file   Other Topics Concern  . Not on file   Social History Narrative   Pt is divorced.               Current Outpatient Prescriptions on File Prior to Visit  Medication Sig Dispense Refill  . aspirin 81 MG tablet Take 81 mg by mouth daily.      Astrid Drafts CAPS 1 cap daily such as MegaRed daily      . Red Yeast Rice Extract (RED YEAST RICE PO) Take by  mouth every other day.       . sildenafil (VIAGRA) 100 MG tablet Take 1 tablet (100 mg total) by mouth daily as needed.  10 tablet  6   No current facility-administered medications on file prior to visit.    Allergies  Allergen Reactions  . Codeine     itching    Review of Systems  Review of Systems  Constitutional: Negative for fever and malaise/fatigue.  HENT: Negative for congestion.   Eyes: Negative for discharge.  Respiratory: Negative for shortness of breath.   Cardiovascular: Negative for chest pain, palpitations and leg swelling.  Gastrointestinal: Negative for nausea, abdominal pain and diarrhea.  Genitourinary: Negative for dysuria.  Musculoskeletal: Negative for falls.  Skin: Negative for rash.  Neurological: Negative for loss of consciousness and headaches.  Endo/Heme/Allergies: Negative for polydipsia.  Psychiatric/Behavioral: Negative for depression and suicidal ideas. The patient is not nervous/anxious and does not have insomnia.     Objective  BP 112/78  Pulse 79  Temp(Src) 98.2 F (36.8 C) (Oral)  Ht 6' (1.829 m)  Wt 196 lb 1.9 oz (  88.959 kg)  BMI 26.59 kg/m2  SpO2 98%  Physical Exam  Physical Exam  Constitutional: He is oriented to person, place, and time and well-developed, well-nourished, and in no distress. No distress.  HENT:  Head: Normocephalic and atraumatic.  Eyes: Conjunctivae are normal.  Neck: Neck supple. No thyromegaly present.  Cardiovascular: Normal rate, regular rhythm and normal heart sounds.   No murmur heard. Pulmonary/Chest: Effort normal and breath sounds normal. No respiratory distress.  Abdominal: He exhibits no distension and no mass. There is no tenderness.  Musculoskeletal: He exhibits no edema.  Neurological: He is alert and oriented to person, place, and time.  Skin: Skin is warm.  Psychiatric: Memory, affect and judgment normal.    Lab Results  Component Value Date   TSH 1.149 08/26/2013   Lab Results   Component Value Date   WBC 5.4 05/26/2013   HGB 13.8 05/26/2013   HCT 40.7 05/26/2013   MCV 93.1 05/26/2013   PLT 275 05/26/2013   Lab Results  Component Value Date   CREATININE 1.20 05/26/2013   BUN 13 05/26/2013   NA 137 05/26/2013   K 4.2 05/26/2013   CL 101 05/26/2013   CO2 24 05/26/2013   Lab Results  Component Value Date   ALT 20 05/26/2013   AST 23 05/26/2013   ALKPHOS 45 05/26/2013   BILITOT 0.5 05/26/2013   Lab Results  Component Value Date   CHOL 200 05/26/2013   Lab Results  Component Value Date   HDL 43 05/26/2013   Lab Results  Component Value Date   LDLCALC 132* 05/26/2013   Lab Results  Component Value Date   TRIG 125 05/26/2013   Lab Results  Component Value Date   CHOLHDL 4.7 05/26/2013     Assessment & Plan  INSOMNIA, CHRONIC Works night shift frequently, is having trouble adjusting his sleep. Has used Zolpidem in past works but sometimes only gets him 4 hours sleep at a time  Routine general medical examination at a health care facility Encouraged heart healthy diet, regular exercise, adequate sleep. reviewed annual labs. Flu shot UTD. Referred to GI for screening colonoscopy  HYPOTHYROIDISM Good response to current dose of Levothyroxine  HYPERLIPIDEMIA, MILD Tolerating red yeast rice, avoid trans fats, increase exercise  Allergic state Using flonase prn

## 2013-09-04 NOTE — Assessment & Plan Note (Signed)
Using flonase prn

## 2013-09-04 NOTE — Assessment & Plan Note (Addendum)
Encouraged heart healthy diet, regular exercise, adequate sleep. reviewed annual labs. Flu shot UTD. Referred to GI for screening colonoscopy

## 2013-09-08 ENCOUNTER — Encounter: Payer: Self-pay | Admitting: Internal Medicine

## 2013-10-25 ENCOUNTER — Ambulatory Visit (AMBULATORY_SURGERY_CENTER): Payer: Self-pay

## 2013-10-25 VITALS — Ht 72.0 in | Wt 202.2 lb

## 2013-10-25 DIAGNOSIS — Z1211 Encounter for screening for malignant neoplasm of colon: Secondary | ICD-10-CM

## 2013-10-25 MED ORDER — NA SULFATE-K SULFATE-MG SULF 17.5-3.13-1.6 GM/177ML PO SOLN: ORAL | Status: DC

## 2013-10-31 ENCOUNTER — Encounter: Payer: Self-pay | Admitting: Internal Medicine

## 2013-11-08 ENCOUNTER — Ambulatory Visit (AMBULATORY_SURGERY_CENTER): Payer: 59 | Admitting: Internal Medicine

## 2013-11-08 ENCOUNTER — Encounter: Payer: Self-pay | Admitting: Internal Medicine

## 2013-11-08 VITALS — BP 107/70 | HR 50 | Temp 98.3°F | Resp 13 | Ht 72.0 in | Wt 202.0 lb

## 2013-11-08 DIAGNOSIS — K648 Other hemorrhoids: Secondary | ICD-10-CM

## 2013-11-08 DIAGNOSIS — D126 Benign neoplasm of colon, unspecified: Secondary | ICD-10-CM

## 2013-11-08 DIAGNOSIS — Z1211 Encounter for screening for malignant neoplasm of colon: Secondary | ICD-10-CM

## 2013-11-08 DIAGNOSIS — K5730 Diverticulosis of large intestine without perforation or abscess without bleeding: Secondary | ICD-10-CM

## 2013-11-08 MED ORDER — SODIUM CHLORIDE 0.9 % IV SOLN: 500.0000 mL | INTRAVENOUS | Status: DC

## 2013-11-08 NOTE — Progress Notes (Signed)
Lidocaine-40mg IV prior to Propofol InductionPropofol given over incremental dosages 

## 2013-11-08 NOTE — Patient Instructions (Addendum)
I found and removed one small polyp that looks benign. You also have a condition called diverticulosis - common and not usually a problem. Yours is mild. Please read the handout provided. Small hemorrhoids seen.  If you have hemorrhoid problems (swelling, itching, bleeding) I am able to treat those with an in-office procedure. If you like, please call my office at [phone] to schedule an appointment and I can evaluate you further.  I will let you know pathology results and when to have another routine colonoscopy by mail.  I appreciate the opportunity to care for you. Gatha Mayer, MD, FACG   YOU HAD AN ENDOSCOPIC PROCEDURE TODAY AT [facility]: Refer to the procedure report that was given to you for any specific questions about what was found during the examination.  If the procedure report does not answer your questions, please call your gastroenterologist to clarify.  If you requested that your care partner not be given the details of your procedure findings, then the procedure report has been included in a sealed envelope for you to review at your convenience later.  YOU SHOULD EXPECT: Some feelings of bloating in the abdomen. Passage of more gas than usual.  Walking can help get rid of the air that was put into your GI tract during the procedure and reduce the bloating. If you had a lower endoscopy (such as a colonoscopy or flexible sigmoidoscopy) you may notice spotting of blood in your stool or on the toilet paper. If you underwent a bowel prep for your procedure, then you may not have a normal bowel movement for a few days.  DIET: Your first meal following the procedure should be a light meal and then it is ok to progress to your normal diet.  A half-sandwich or bowl of soup is an example of a good first meal.  Heavy or fried foods are harder to digest and may make you feel nauseous or bloated.  Likewise meals heavy in dairy and vegetables can cause extra gas to form and  this can also increase the bloating.  Drink plenty of fluids but you should avoid alcoholic beverages for 24 hours.  ACTIVITY: Your care partner should take you home directly after the procedure.  You should plan to take it easy, moving slowly for the rest of the day.  You can resume normal activity the day after the procedure however you should NOT DRIVE or use heavy machinery for 24 hours (because of the sedation medicines used during the test).    SYMPTOMS TO REPORT IMMEDIATELY: A gastroenterologist can be reached at any hour.  During normal business hours, 8:30 AM to 5:00 PM Monday through Friday, call [phone].  After hours and on weekends, please call the GI answering service at [phone] who will take a message and have the physician on call contact you.   Following lower endoscopy (colonoscopy or flexible sigmoidoscopy):  Excessive amounts of blood in the stool  Significant tenderness or worsening of abdominal pains  Swelling of the abdomen that is new, acute  Fever of 100F or higher  FOLLOW UP: If any biopsies were taken you will be contacted by phone or by letter within the next 1-3 weeks.  Call your gastroenterologist if you have not heard about the biopsies in 3 weeks.  Our staff will call the home number listed on your records the next business day following your procedure to check on you and address any questions or concerns that you may  have at that time regarding the information given to you following your procedure. This is a courtesy call and so if there is no answer at the home number and we have not heard from you through the emergency physician on call, we will assume that you have returned to your regular daily activities without incident.  SIGNATURES/CONFIDENTIALITY: You and/or your care partner have signed paperwork which will be entered into your electronic medical record.  These signatures attest to the fact that that the information above on your After Visit  Summary has been reviewed and is understood.  Full responsibility of the confidentiality of this discharge information lies with you and/or your care-partner.

## 2013-11-08 NOTE — Op Note (Signed)
[facility]  [address] [place], [id number]   COLONOSCOPY PROCEDURE REPORT  PATIENT: Joel Owen, Joel Owen  MR#: [id number] BIRTHDATE: [birth year] , 48  yrs. old GENDER: Male ENDOSCOPIST: Gatha Mayer, MD, [facility] REFERRED BY:        Willette Alma, MD PROCEDURE DATE:  11/08/2013 PROCEDURE:   Colonoscopy with snare polypectomy First Screening Colonoscopy - Avg.  risk and is 50 yrs.  old or older Yes.  Prior Negative Screening - Now for repeat screening. N/A  History of Adenoma - Now for follow-up colonoscopy & has been > or = to 3 yrs.  N/A  Polyps Removed Today? Yes. ASA CLASS:   Class II INDICATIONS:average risk screening and first colonoscopy. MEDICATIONS: propofol (Diprivan) 300mg  IV, MAC sedation, administered by CRNA, and These medications were titrated to patient response per physician's verbal order  DESCRIPTION OF PROCEDURE:   After the risks benefits and alternatives of the procedure were thoroughly explained, informed consent was obtained.  A digital rectal exam revealed no abnormalities of the rectum, A digital rectal exam revealed no prostatic nodules, and A digital rectal exam revealed the prostate was not enlarged.   The LB [id number]  endoscope was introduced through the anus and advanced to the cecum, which was identified by both the appendix and ileocecal valve. No adverse events experienced.   The quality of the prep was Suprep good  The instrument was then slowly withdrawn as the colon was fully examined.  COLON FINDINGS: A diminutive sessile polyp was found in the descending colon.  A polypectomy was performed with a cold snare. The resection was complete and the polyp tissue was completely retrieved.   There was mild scattered diverticulosis noted throughout the entire examined colon.   The colon mucosa was otherwise normal.  Retroflexed views revealed internal hemorrhoids. The time to cecum=3 minutes 32 seconds.  Withdrawal time=9  minutes 13 seconds.  The scope was withdrawn and the procedure completed. COMPLICATIONS: There were no complications.  ENDOSCOPIC IMPRESSION: 1.   Diminutive sessile polyp was found in the descending colon; polypectomy was performed with a cold snare 2.   There was mild diverticulosis noted throughout the entire examined colon 3.   The colon mucosa was otherwise normal - good prep -first colonoscopy  RECOMMENDATIONS: 1.  Timing of repeat colonoscopy will be determined by pathology findings. 2.   If hemorrhoids causing problems he may schedule office visit to discuss possible banding.   eSigned:  Gatha Mayer, MD, [facility] 11/08/2013 8:33 AM   cc: Willette Alma, MD and The Patient

## 2013-11-08 NOTE — Progress Notes (Signed)
Called to room to assist during endoscopic procedure.  Patient ID and intended procedure confirmed with present staff. Received instructions for my participation in the procedure from the performing physician.  

## 2013-11-09 ENCOUNTER — Telehealth: Payer: Self-pay | Admitting: *Deleted

## 2013-11-09 NOTE — Telephone Encounter (Signed)
  Follow up Call-  Call back number 11/08/2013  Post procedure Call Back phone  # [phone]  Permission to leave phone message Yes     Patient questions:  Do you have a fever, pain , or abdominal swelling? no Pain Score  0  Have you tolerated food without any problems? yes  Have you been able to return to your normal activities? yes  Do you have any questions about your discharge instructions: Diet   no Medications  no Follow up visit  no  Do you have questions or concerns about your Care? no  Actions: * If pain score is 4 or above: No action needed, pain <4.

## 2013-11-14 ENCOUNTER — Encounter: Payer: Self-pay | Admitting: Internal Medicine

## 2013-11-14 NOTE — Progress Notes (Signed)
Quick Note:  Hyperplastic polyp - repeat colon 2025 ______ 

## 2014-03-07 ENCOUNTER — Telehealth: Payer: Self-pay

## 2014-03-07 DIAGNOSIS — E039 Hypothyroidism, unspecified: Secondary | ICD-10-CM

## 2014-03-07 DIAGNOSIS — E785 Hyperlipidemia, unspecified: Secondary | ICD-10-CM

## 2014-03-07 DIAGNOSIS — N401 Enlarged prostate with lower urinary tract symptoms: Secondary | ICD-10-CM

## 2014-03-07 DIAGNOSIS — N138 Other obstructive and reflux uropathy: Secondary | ICD-10-CM

## 2014-03-07 LAB — HEPATIC FUNCTION PANEL
ALT: 22 U/L (ref 0–53)
AST: 25 U/L (ref 0–37)
Albumin: 4.6 g/dL (ref 3.5–5.2)
Alkaline Phosphatase: 50 U/L (ref 39–117)
BILIRUBIN INDIRECT: 0.5 mg/dL (ref 0.2–1.2)
BILIRUBIN TOTAL: 0.6 mg/dL (ref 0.2–1.2)
Bilirubin, Direct: 0.1 mg/dL (ref 0.0–0.3)
Total Protein: 7 g/dL (ref 6.0–8.3)

## 2014-03-07 LAB — RENAL FUNCTION PANEL
Albumin: 4.6 g/dL (ref 3.5–5.2)
BUN: 12 mg/dL (ref 6–23)
CO2: 29 mEq/L (ref 19–32)
Calcium: 9.7 mg/dL (ref 8.4–10.5)
Chloride: 104 mEq/L (ref 96–112)
Creat: 1.19 mg/dL (ref 0.50–1.35)
Glucose, Bld: 95 mg/dL (ref 70–99)
Phosphorus: 4.5 mg/dL (ref 2.3–4.6)
Potassium: 4.4 mEq/L (ref 3.5–5.3)
SODIUM: 140 meq/L (ref 135–145)

## 2014-03-07 LAB — CBC
HCT: 39.4 % (ref 39.0–52.0)
Hemoglobin: 14 g/dL (ref 13.0–17.0)
MCH: 33 pg (ref 26.0–34.0)
MCHC: 35.5 g/dL (ref 30.0–36.0)
MCV: 92.9 fL (ref 78.0–100.0)
PLATELETS: 245 10*3/uL (ref 150–400)
RBC: 4.24 MIL/uL (ref 4.22–5.81)
RDW: 13.4 % (ref 11.5–15.5)
WBC: 5.6 10*3/uL (ref 4.0–10.5)

## 2014-03-07 LAB — LIPID PANEL
Cholesterol: 202 mg/dL — ABNORMAL HIGH (ref 0–200)
HDL: 44 mg/dL (ref >39)
LDL CALC: 122 mg/dL — AB (ref 0–99)
Total CHOL/HDL Ratio: 4.6 Ratio
Triglycerides: 182 mg/dL — ABNORMAL HIGH (ref <150)
VLDL: 36 mg/dL (ref 0–40)

## 2014-03-07 LAB — TSH: TSH: 2.666 u[IU]/mL (ref 0.350–4.500)

## 2014-03-07 NOTE — Telephone Encounter (Signed)
Lab order placed.

## 2014-03-08 LAB — PSA: PSA: 0.35 ng/mL (ref <=4.00)

## 2014-06-26 ENCOUNTER — Other Ambulatory Visit: Payer: Self-pay

## 2014-06-26 DIAGNOSIS — N529 Male erectile dysfunction, unspecified: Secondary | ICD-10-CM

## 2014-06-26 MED ORDER — SILDENAFIL CITRATE 100 MG PO TABS: 100.0000 mg | ORAL_TABLET | Freq: Every day | ORAL | Status: DC | PRN

## 2014-06-26 NOTE — Telephone Encounter (Signed)
Please advise viagra and zolpidem refill? Pt hasn't been seen since 08-2013

## 2014-06-27 ENCOUNTER — Other Ambulatory Visit: Payer: Self-pay | Admitting: Family Medicine

## 2014-06-27 ENCOUNTER — Ambulatory Visit (INDEPENDENT_AMBULATORY_CARE_PROVIDER_SITE_OTHER): Payer: 59

## 2014-06-27 DIAGNOSIS — R52 Pain, unspecified: Secondary | ICD-10-CM

## 2014-06-27 DIAGNOSIS — S92401A Displaced unspecified fracture of right great toe, initial encounter for closed fracture: Secondary | ICD-10-CM

## 2014-06-27 NOTE — Telephone Encounter (Signed)
Please advise refills? Last OV was 09-01-13  If ok fax to [phone]

## 2014-06-27 NOTE — Progress Notes (Signed)
   Subjective:    Patient ID: Joel Owen, male    DOB: 1965-11-27, 48 y.o.   MRN: [id number]  HPI  PT [place] AND IS SWOLLEN AND SORE. THE TOE IS GETTING BETTER BUT GET WORSE WHEN WEARING CERTAIN TYPE OF SHOES. TRIED ICE/HEAT AND ALEVE  IT HELP TEMPORARY.  Review of Systems  All other systems reviewed and are negative.      Objective:   Physical Exam Neurovascular status is intact pedal pulses are palpable epicritic and proprioceptive sensations intact and symmetric bilateral there is normal plantar response DTRs not elicited dermal skin color pigment and hair growth are normal patient does have pain tenderness of the hallux IP joint right great toe there was a history of swelling he has pictures on his phone demonstrates swelling and ecchymosis of the toe is happening about 3 months ago in [place] on [place] water he jammed his big toe against the ground or stand. Remainder the exam unremarkable x-rays taken this time AP lateral oblique views reveal a partial oblique fracture of the proximal phalanx the distal articular surface. There is slight displacement or compression of the capital fragment of articular surface however no severe displacements no angular deformity in the toe mild edema is present there is reduced range of motion as compared to the contralateral or left foot. Again fracture intra-articular at the proximal phalanx head.       Assessment & Plan:  Assessment phalanx fracture right great toe initial evaluation he should have been wrapping it and using compression maintain a stiff soled shoe was not sure if he broken or not however x-rays today confirm a fracture of the toe which appears to be healing a consolidating with slight or minimal displacement of the articular surface. Patient is advised he will have likely some reduction range of motion and posse some crepitus is a permanent sequela of the fracture and  slight displacement at this point with consolidation taken place and now feel is necessary go refracture and realign the digit to the hallux is likely not going to be missed by most average walking or activities he will not be able to grasp things with his toe. Patient is aware of that and likely not in his knee of that function. At this time recommendation maintain Coflex wrapping to keep down compression on the toe and allow to continue healing patient is advised swelling and ecchymosis of the toe following a fracture or surgery can last for 3-6 months months although greatly improved still has some occasional pain tenderness and aching patient is advised this may resolve over the next 3-6 more months  Harriet Masson DPM

## 2014-06-27 NOTE — Patient Instructions (Signed)
ICE INSTRUCTIONS  Apply ice or cold pack to the affected area at least 3 times a day for 10-15 minutes each time.  You should also use ice after prolonged activity or vigorous exercise.  Do not apply ice longer than 20 minutes at one time.  Always keep a cloth between your skin and the ice pack to prevent burns.  Being consistent and following these instructions will help control your symptoms.  We suggest you purchase a gel ice pack because they are reusable and do bit leak.  Some of them are designed to wrap around the area.  Use the method that works best for you.  Here are some other suggestions for icing.   Use a frozen bag of peas or corn-inexpensive and molds well to your body, usually stays frozen for 10 to 20 minutes.  Wet a towel with cold water and squeeze out the excess until it's damp.  Place in a bag in the freezer for 20 minutes. Then remove and use.  Maintain a light Coflex compression wrapping on toe for the next month or 2

## 2014-07-07 ENCOUNTER — Other Ambulatory Visit: Payer: Self-pay | Admitting: *Deleted

## 2014-07-07 MED ORDER — ZOLPIDEM TARTRATE 10 MG PO TABS: 10.0000 mg | ORAL_TABLET | Freq: Every evening | ORAL | Status: DC | PRN

## 2014-07-10 NOTE — Telephone Encounter (Signed)
Rx printed and signed and faxed to the pharmacy([facility]) on ( 07/07/14).//AB/CMA

## 2014-08-18 ENCOUNTER — Other Ambulatory Visit: Payer: Self-pay | Admitting: Family Medicine

## 2014-08-20 ENCOUNTER — Other Ambulatory Visit: Payer: Self-pay | Admitting: Family Medicine

## 2014-08-21 NOTE — Telephone Encounter (Signed)
Requesting Ambien 10mg -Take 1 tablet by mouth at bedtime as needed for sleep. Last refill:07/07/14;#30,0 Last OV:09/01/13-(f/u 6 mos)-has appt for 09/06/14 w/Cody Please advise.//AB/CMA

## 2014-09-06 ENCOUNTER — Encounter: Payer: Self-pay | Admitting: Physician Assistant

## 2014-09-06 ENCOUNTER — Ambulatory Visit (INDEPENDENT_AMBULATORY_CARE_PROVIDER_SITE_OTHER): Payer: 59 | Admitting: Physician Assistant

## 2014-09-06 VITALS — BP 128/82 | HR 62 | Temp 98.2°F | Resp 16 | Ht 72.0 in | Wt 203.2 lb

## 2014-09-06 DIAGNOSIS — N529 Male erectile dysfunction, unspecified: Secondary | ICD-10-CM

## 2014-09-06 DIAGNOSIS — Z0000 Encounter for general adult medical examination without abnormal findings: Secondary | ICD-10-CM

## 2014-09-06 DIAGNOSIS — G4700 Insomnia, unspecified: Secondary | ICD-10-CM

## 2014-09-06 LAB — LIPID PANEL
CHOL/HDL RATIO: 5
Cholesterol: 225 mg/dL — ABNORMAL HIGH (ref 0–200)
HDL: 43.7 mg/dL (ref >39.00)
NONHDL: 181.3
Triglycerides: 216 mg/dL — ABNORMAL HIGH (ref 0.0–149.0)
VLDL: 43.2 mg/dL — ABNORMAL HIGH (ref 0.0–40.0)

## 2014-09-06 LAB — CBC
HCT: 40.3 % (ref 39.0–52.0)
HEMOGLOBIN: 14 g/dL (ref 13.0–17.0)
MCHC: 34.7 g/dL (ref 30.0–36.0)
MCV: 93.6 fl (ref 78.0–100.0)
Platelets: 227 10*3/uL (ref 150.0–400.0)
RBC: 4.31 Mil/uL (ref 4.22–5.81)
RDW: 12.4 % (ref 11.5–15.5)
WBC: 4.5 10*3/uL (ref 4.0–10.5)

## 2014-09-06 LAB — URINALYSIS, ROUTINE W REFLEX MICROSCOPIC
BILIRUBIN URINE: NEGATIVE
HGB URINE DIPSTICK: NEGATIVE
Ketones, ur: NEGATIVE
Leukocytes, UA: NEGATIVE
Nitrite: NEGATIVE
PH: 6.5 (ref 5.0–8.0)
RBC / HPF: NONE SEEN (ref 0–?)
Specific Gravity, Urine: 1.01 (ref 1.000–1.030)
TOTAL PROTEIN, URINE-UPE24: NEGATIVE
Urine Glucose: NEGATIVE
Urobilinogen, UA: 0.2 (ref 0.0–1.0)
WBC, UA: NONE SEEN (ref 0–?)

## 2014-09-06 LAB — BASIC METABOLIC PANEL
BUN: 14 mg/dL (ref 6–23)
CHLORIDE: 105 meq/L (ref 96–112)
CO2: 25 meq/L (ref 19–32)
Calcium: 9.8 mg/dL (ref 8.4–10.5)
Creatinine, Ser: 1.04 mg/dL (ref 0.40–1.50)
GFR: 80.05 mL/min (ref >60.00)
GLUCOSE: 91 mg/dL (ref 70–99)
POTASSIUM: 4.1 meq/L (ref 3.5–5.1)
Sodium: 138 mEq/L (ref 135–145)

## 2014-09-06 LAB — HEPATIC FUNCTION PANEL
ALK PHOS: 43 U/L (ref 39–117)
ALT: 22 U/L (ref 0–53)
AST: 22 U/L (ref 0–37)
Albumin: 4.5 g/dL (ref 3.5–5.2)
BILIRUBIN TOTAL: 1 mg/dL (ref 0.2–1.2)
Bilirubin, Direct: 0.2 mg/dL (ref 0.0–0.3)
Total Protein: 7 g/dL (ref 6.0–8.3)

## 2014-09-06 LAB — PSA: PSA: 0.28 ng/mL (ref 0.10–4.00)

## 2014-09-06 LAB — LDL CHOLESTEROL, DIRECT: LDL DIRECT: 133 mg/dL

## 2014-09-06 LAB — HEMOGLOBIN A1C: Hgb A1c MFr Bld: 5.8 % (ref 4.6–6.5)

## 2014-09-06 LAB — TSH: TSH: 1.29 u[IU]/mL (ref 0.35–4.50)

## 2014-09-06 MED ORDER — ZOLPIDEM TARTRATE 10 MG PO TABS: 10.0000 mg | ORAL_TABLET | Freq: Every evening | ORAL | Status: DC | PRN

## 2014-09-06 MED ORDER — SILDENAFIL CITRATE 100 MG PO TABS: 100.0000 mg | ORAL_TABLET | Freq: Every day | ORAL | Status: DC | PRN

## 2014-09-06 NOTE — Patient Instructions (Signed)
Please go to the lab for blood work.  I will call you with your results. Please continue medications as directed.  We will alter your regimen based on results.  Preventive Care for Adults A healthy lifestyle and preventive care can promote health and wellness. Preventive health guidelines for men include the following key practices:  A routine yearly physical is a good way to check with your health care provider about your health and preventative screening. It is a chance to share any concerns and updates on your health and to receive a thorough exam.  Visit your dentist for a routine exam and preventative care every 6 months. Brush your teeth twice a day and floss once a day. Good oral hygiene prevents tooth decay and gum disease.  The frequency of eye exams is based on your age, health, family medical history, use of contact lenses, and other factors. Follow your health care provider's recommendations for frequency of eye exams.  Eat a healthy diet. Foods such as vegetables, fruits, whole grains, low-fat dairy products, and lean protein foods contain the nutrients you need without too many calories. Decrease your intake of foods high in solid fats, added sugars, and salt. Eat the right amount of calories for you.Get information about a proper diet from your health care provider, if necessary.  Regular physical exercise is one of the most important things you can do for your health. Most adults should get at least 150 minutes of moderate-intensity exercise (any activity that increases your heart rate and causes you to sweat) each week. In addition, most adults need muscle-strengthening exercises on 2 or more days a week.  Maintain a healthy weight. The body mass index (BMI) is a screening tool to identify possible weight problems. It provides an estimate of body fat based on height and weight. Your health care provider can find your BMI and can help you achieve or maintain a healthy weight.For  adults 20 years and older:  A BMI below 18.5 is considered underweight.  A BMI of 18.5 to 24.9 is normal.  A BMI of 25 to 29.9 is considered overweight.  A BMI of 30 and above is considered obese.  Maintain normal blood lipids and cholesterol levels by exercising and minimizing your intake of saturated fat. Eat a balanced diet with plenty of fruit and vegetables. Blood tests for lipids and cholesterol should begin at age 79 and be repeated every 5 years. If your lipid or cholesterol levels are high, you are over 50, or you are at high risk for heart disease, you may need your cholesterol levels checked more frequently.Ongoing high lipid and cholesterol levels should be treated with medicines if diet and exercise are not working.  If you smoke, find out from your health care provider how to quit. If you do not use tobacco, do not start.  Lung cancer screening is recommended for adults aged 44-80 years who are at high risk for developing lung cancer because of a history of smoking. A yearly low-dose CT scan of the lungs is recommended for people who have at least a 30-pack-year history of smoking and are a current smoker or have quit within the past 15 years. A pack year of smoking is smoking an average of 1 pack of cigarettes a day for 1 year (for example: 1 pack a day for 30 years or 2 packs a day for 15 years). Yearly screening should continue until the smoker has stopped smoking for at least 15 years. Yearly screening  should be stopped for people who develop a health problem that would prevent them from having lung cancer treatment.  If you choose to drink alcohol, do not have more than 2 drinks per day. One drink is considered to be 12 ounces (355 mL) of beer, 5 ounces (148 mL) of wine, or 1.5 ounces (44 mL) of liquor.  Avoid use of street drugs. Do not share needles with anyone. Ask for help if you need support or instructions about stopping the use of drugs.  High blood pressure causes  heart disease and increases the risk of stroke. Your blood pressure should be checked at least every 1-2 years. Ongoing high blood pressure should be treated with medicines, if weight loss and exercise are not effective.  If you are 69-38 years old, ask your health care provider if you should take aspirin to prevent heart disease.  Diabetes screening involves taking a blood sample to check your fasting blood sugar level. This should be done once every 3 years, after age 46, if you are within normal weight and without risk factors for diabetes. Testing should be considered at a younger age or be carried out more frequently if you are overweight and have at least 1 risk factor for diabetes.  Colorectal cancer can be detected and often prevented. Most routine colorectal cancer screening begins at the age of 88 and continues through age 72. However, your health care provider may recommend screening at an earlier age if you have risk factors for colon cancer. On a yearly basis, your health care provider may provide home test kits to check for hidden blood in the stool. Use of a small camera at the end of a tube to directly examine the colon (sigmoidoscopy or colonoscopy) can detect the earliest forms of colorectal cancer. Talk to your health care provider about this at age 32, when routine screening begins. Direct exam of the colon should be repeated every 5-10 years through age 55, unless early forms of precancerous polyps or small growths are found.  People who are at an increased risk for hepatitis B should be screened for this virus. You are considered at high risk for hepatitis B if:  You were born in a country where hepatitis B occurs often. Talk with your health care provider about which countries are considered high risk.  Your parents were born in a high-risk country and you have not received a shot to protect against hepatitis B (hepatitis B vaccine).  You have HIV or AIDS.  You use needles to  inject street drugs.  You live with, or have sex with, someone who has hepatitis B.  You are a man who has sex with other men (MSM).  You get hemodialysis treatment.  You take certain medicines for conditions such as cancer, organ transplantation, and autoimmune conditions.  Hepatitis C blood testing is recommended for all people born from [birth year] through 1965 and any individual with known risks for hepatitis C.  Practice safe sex. Use condoms and avoid high-risk sexual practices to reduce the spread of sexually transmitted infections (STIs). STIs include gonorrhea, chlamydia, syphilis, trichomonas, herpes, HPV, and human immunodeficiency virus (HIV). Herpes, HIV, and HPV are viral illnesses that have no cure. They can result in disability, cancer, and death.  If you are at risk of being infected with HIV, it is recommended that you take a prescription medicine daily to prevent HIV infection. This is called preexposure prophylaxis (PrEP). You are considered at risk if:  You are  a man who has sex with other men (MSM) and have other risk factors.  You are a heterosexual man, are sexually active, and are at increased risk for HIV infection.  You take drugs by injection.  You are sexually active with a partner who has HIV.  Talk with your health care provider about whether you are at high risk of being infected with HIV. If you choose to begin PrEP, you should first be tested for HIV. You should then be tested every 3 months for as long as you are taking PrEP.  A one-time screening for abdominal aortic aneurysm (AAA) and surgical repair of large AAAs by ultrasound are recommended for men ages 50 to 22 years who are current or former smokers.  Healthy men should no longer receive prostate-specific antigen (PSA) blood tests as part of routine cancer screening. Talk with your health care provider about prostate cancer screening.  Testicular cancer screening is not recommended for adult males who  have no symptoms. Screening includes self-exam, a health care provider exam, and other screening tests. Consult with your health care provider about any symptoms you have or any concerns you have about testicular cancer.  Use sunscreen. Apply sunscreen liberally and repeatedly throughout the day. You should seek shade when your shadow is shorter than you. Protect yourself by wearing long sleeves, pants, a wide-brimmed hat, and sunglasses year round, whenever you are outdoors.  Once a month, do a whole-body skin exam, using a mirror to look at the skin on your back. Tell your health care provider about new moles, moles that have irregular borders, moles that are larger than a pencil eraser, or moles that have changed in shape or color.  Stay current with required vaccines (immunizations).  Influenza vaccine. All adults should be immunized every year.  Tetanus, diphtheria, and acellular pertussis (Td, Tdap) vaccine. An adult who has not previously received Tdap or who does not know his vaccine status should receive 1 dose of Tdap. This initial dose should be followed by tetanus and diphtheria toxoids (Td) booster doses every 10 years. Adults with an unknown or incomplete history of completing a 3-dose immunization series with Td-containing vaccines should begin or complete a primary immunization series including a Tdap dose. Adults should receive a Td booster every 10 years.  Varicella vaccine. An adult without evidence of immunity to varicella should receive 2 doses or a second dose if he has previously received 1 dose.  Human papillomavirus (HPV) vaccine. Males aged 7-21 years who have not received the vaccine previously should receive the 3-dose series. Males aged 22-26 years may be immunized. Immunization is recommended through the age of 34 years for any male who has sex with males and did not get any or all doses earlier. Immunization is recommended for any person with an immunocompromised  condition through the age of 31 years if he did not get any or all doses earlier. During the 3-dose series, the second dose should be obtained 4-8 weeks after the first dose. The third dose should be obtained 24 weeks after the first dose and 16 weeks after the second dose.  Zoster vaccine. One dose is recommended for adults aged 65 years or older unless certain conditions are present.  Measles, mumps, and rubella (MMR) vaccine. Adults born before [birth year] generally are considered immune to measles and mumps. Adults born in [birth year] or later should have 1 or more doses of MMR vaccine unless there is a contraindication to the vaccine or there is  laboratory evidence of immunity to each of the three diseases. A routine second dose of MMR vaccine should be obtained at least 28 days after the first dose for students attending postsecondary schools, health care workers, or international travelers. People who received inactivated measles vaccine or an unknown type of measles vaccine during 1963-1967 should receive 2 doses of MMR vaccine. People who received inactivated mumps vaccine or an unknown type of mumps vaccine before 1979 and are at high risk for mumps infection should consider immunization with 2 doses of MMR vaccine. Unvaccinated health care workers born before [birth year] who lack laboratory evidence of measles, mumps, or rubella immunity or laboratory confirmation of disease should consider measles and mumps immunization with 2 doses of MMR vaccine or rubella immunization with 1 dose of MMR vaccine.  Pneumococcal 13-valent conjugate (PCV13) vaccine. When indicated, a person who is uncertain of his immunization history and has no record of immunization should receive the PCV13 vaccine. An adult aged 93 years or older who has certain medical conditions and has not been previously immunized should receive 1 dose of PCV13 vaccine. This PCV13 should be followed with a dose of pneumococcal polysaccharide (PPSV23) vaccine. The  PPSV23 vaccine dose should be obtained at least 8 weeks after the dose of PCV13 vaccine. An adult aged 20 years or older who has certain medical conditions and previously received 1 or more doses of PPSV23 vaccine should receive 1 dose of PCV13. The PCV13 vaccine dose should be obtained 1 or more years after the last PPSV23 vaccine dose.  Pneumococcal polysaccharide (PPSV23) vaccine. When PCV13 is also indicated, PCV13 should be obtained first. All adults aged 32 years and older should be immunized. An adult younger than age 92 years who has certain medical conditions should be immunized. Any person who resides in a nursing home or long-term care facility should be immunized. An adult smoker should be immunized. People with an immunocompromised condition and certain other conditions should receive both PCV13 and PPSV23 vaccines. People with human immunodeficiency virus (HIV) infection should be immunized as soon as possible after diagnosis. Immunization during chemotherapy or radiation therapy should be avoided. Routine use of PPSV23 vaccine is not recommended for American Indians, [place] Natives, or people younger than 65 years unless there are medical conditions that require PPSV23 vaccine. When indicated, people who have unknown immunization and have no record of immunization should receive PPSV23 vaccine. One-time revaccination 5 years after the first dose of PPSV23 is recommended for people aged 19-64 years who have chronic kidney failure, nephrotic syndrome, asplenia, or immunocompromised conditions. People who received 1-2 doses of PPSV23 before age 73 years should receive another dose of PPSV23 vaccine at age 32 years or later if at least 5 years have passed since the previous dose. Doses of PPSV23 are not needed for people immunized with PPSV23 at or after age 74 years.  Meningococcal vaccine. Adults with asplenia or persistent complement component deficiencies should receive 2 doses of quadrivalent  meningococcal conjugate (MenACWY-D) vaccine. The doses should be obtained at least 2 months apart. Microbiologists working with certain meningococcal bacteria, [employer] recruits, people at risk during an outbreak, and people who travel to or live in countries with a high rate of meningitis should be immunized. A first-year college student up through age 20 years who is living in a residence hall should receive a dose if he did not receive a dose on or after his 16th birthday. Adults who have certain high-risk conditions should receive one or more  doses of vaccine.  Hepatitis A vaccine. Adults who wish to be protected from this disease, have certain high-risk conditions, work with hepatitis A-infected animals, work in hepatitis A research labs, or travel to or work in countries with a high rate of hepatitis A should be immunized. Adults who were previously unvaccinated and who anticipate close contact with an international adoptee during the first 60 days after arrival in the [place] from a country with a high rate of hepatitis A should be immunized.  Hepatitis B vaccine. Adults should be immunized if they wish to be protected from this disease, have certain high-risk conditions, may be exposed to blood or other infectious body fluids, are household contacts or sex partners of hepatitis B positive people, are clients or workers in certain care facilities, or travel to or work in countries with a high rate of hepatitis B.  Haemophilus influenzae type b (Hib) vaccine. A previously unvaccinated person with asplenia or sickle cell disease or having a scheduled splenectomy should receive 1 dose of Hib vaccine. Regardless of previous immunization, a recipient of a hematopoietic stem cell transplant should receive a 3-dose series 6-12 months after his successful transplant. Hib vaccine is not recommended for adults with HIV infection. Preventive Service / Frequency Ages 21 to 27  Blood pressure check.** /  Every 1 to 2 years.  Lipid and cholesterol check.** / Every 5 years beginning at age 24.  Hepatitis C blood test.** / For any individual with known risks for hepatitis C.  Skin self-exam. / Monthly.  Influenza vaccine. / Every year.  Tetanus, diphtheria, and acellular pertussis (Tdap, Td) vaccine.** / Consult your health care provider. 1 dose of Td every 10 years.  Varicella vaccine.** / Consult your health care provider.  HPV vaccine. / 3 doses over 6 months, if 79 or younger.  Measles, mumps, rubella (MMR) vaccine.** / You need at least 1 dose of MMR if you were born in 1959 or later. You may also need a second dose.  Pneumococcal 13-valent conjugate (PCV13) vaccine.** / Consult your health care provider.  Pneumococcal polysaccharide (PPSV23) vaccine.** / 1 to 2 doses if you smoke cigarettes or if you have certain conditions.  Meningococcal vaccine.** / 1 dose if you are age 51 to 32 years and a Market researcher living in a residence hall, or have one of several medical conditions. You may also need additional booster doses.  Hepatitis A vaccine.** / Consult your health care provider.  Hepatitis B vaccine.** / Consult your health care provider.  Haemophilus influenzae type b (Hib) vaccine.** / Consult your health care provider. Ages 68 to 35  Blood pressure check.** / Every 1 to 2 years.  Lipid and cholesterol check.** / Every 5 years beginning at age 31.  Lung cancer screening. / Every year if you are aged 29-80 years and have a 30-pack-year history of smoking and currently smoke or have quit within the past 15 years. Yearly screening is stopped once you have quit smoking for at least 15 years or develop a health problem that would prevent you from having lung cancer treatment.  Fecal occult blood test (FOBT) of stool. / Every year beginning at age 30 and continuing until age 51. You may not have to do this test if you get a colonoscopy every 10 years.  Flexible  sigmoidoscopy** or colonoscopy.** / Every 5 years for a flexible sigmoidoscopy or every 10 years for a colonoscopy beginning at age 33 and continuing until age 27.  Hepatitis C blood test.** / For all people born from [birth year] through 1967 and any individual with known risks for hepatitis C.  Skin self-exam. / Monthly.  Influenza vaccine. / Every year.  Tetanus, diphtheria, and acellular pertussis (Tdap/Td) vaccine.** / Consult your health care provider. 1 dose of Td every 10 years.  Varicella vaccine.** / Consult your health care provider.  Zoster vaccine.** / 1 dose for adults aged 41 years or older.  Measles, mumps, rubella (MMR) vaccine.** / You need at least 1 dose of MMR if you were born in 1959 or later. You may also need a second dose.  Pneumococcal 13-valent conjugate (PCV13) vaccine.** / Consult your health care provider.  Pneumococcal polysaccharide (PPSV23) vaccine.** / 1 to 2 doses if you smoke cigarettes or if you have certain conditions.  Meningococcal vaccine.** / Consult your health care provider.  Hepatitis A vaccine.** / Consult your health care provider.  Hepatitis B vaccine.** / Consult your health care provider.  Haemophilus influenzae type b (Hib) vaccine.** / Consult your health care provider. Ages 18 and over  Blood pressure check.** / Every 1 to 2 years.  Lipid and cholesterol check.**/ Every 5 years beginning at age 45.  Lung cancer screening. / Every year if you are aged 20-80 years and have a 30-pack-year history of smoking and currently smoke or have quit within the past 15 years. Yearly screening is stopped once you have quit smoking for at least 15 years or develop a health problem that would prevent you from having lung cancer treatment.  Fecal occult blood test (FOBT) of stool. / Every year beginning at age 45 and continuing until age 34. You may not have to do this test if you get a colonoscopy every 10 years.  Flexible sigmoidoscopy** or  colonoscopy.** / Every 5 years for a flexible sigmoidoscopy or every 10 years for a colonoscopy beginning at age 23 and continuing until age 66.  Hepatitis C blood test.** / For all people born from [birth year] through 1967 and any individual with known risks for hepatitis C.  Abdominal aortic aneurysm (AAA) screening.** / A one-time screening for ages 20 to 64 years who are current or former smokers.  Skin self-exam. / Monthly.  Influenza vaccine. / Every year.  Tetanus, diphtheria, and acellular pertussis (Tdap/Td) vaccine.** / 1 dose of Td every 10 years.  Varicella vaccine.** / Consult your health care provider.  Zoster vaccine.** / 1 dose for adults aged 71 years or older.  Pneumococcal 13-valent conjugate (PCV13) vaccine.** / Consult your health care provider.  Pneumococcal polysaccharide (PPSV23) vaccine.** / 1 dose for all adults aged 87 years and older.  Meningococcal vaccine.** / Consult your health care provider.  Hepatitis A vaccine.** / Consult your health care provider.  Hepatitis B vaccine.** / Consult your health care provider.  Haemophilus influenzae type b (Hib) vaccine.** / Consult your health care provider. **Family history and personal history of risk and conditions may change your health care provider's recommendations. Document Released: 09/02/2001 Document Revised: 07/12/2013 Document Reviewed: 12/02/2010 [facility] Patient Information 2015 [place], [place]. This information is not intended to replace advice given to you by your health care provider. Make sure you discuss any questions you have with your health care provider.

## 2014-09-06 NOTE — Assessment & Plan Note (Signed)
Ambien refilled.  Increase evening exercise.

## 2014-09-06 NOTE — Assessment & Plan Note (Signed)
I have reviewed the patient's medical history in detail and updated the computerized patient record.  PHQ-9 Depression screen performed with score of 0.  Will obtain fasting labs today.  Preventive care discussed.  Handout given.

## 2014-09-06 NOTE — Progress Notes (Signed)
Patient presents to clinic today for annual exam.  Patient is fasting for labs.  Acute Concerns: No acute concerns today.  Requesting refills of his Ambien for insomnia.  Health Maintenance: Dental -- up-to-date Vision -- up-to-date Immunizations -- up-to-date Colonoscopy -- up-to-date  Past Medical History  Diagnosis Date  . Thyroid disease     hypothyroidism  . BPH (benign prostatic hyperplasia)   . Insomnia   . Adjustment disorder     with depressed mood  . Rectal abscess     history of, s/p surgery  . 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 Allergic state 09/04/2013    per pt/ not sure what reaction    Past Surgical History  Procedure Laterality Date  . Tonsillectomy      1971  . Carpal tunnel release  02/07/2011    Dr  Tyrone Sage BIL  . Hand surgery      2 screws in left hand    Current Outpatient Prescriptions on File Prior to Visit  Medication Sig Dispense Refill  . aspirin 81 MG tablet Take 81 mg by mouth daily.    Marland Kitchen doxylamine, Sleep, (UNISOM) 25 MG tablet Take 25 mg by mouth at bedtime as needed.    . fluticasone (FLONASE) 50 MCG/ACT nasal spray Place 2 sprays into both nostrils daily as needed for allergies or rhinitis. 48 g 3  . Krill Oil CAPS 1 cap daily such as MegaRed daily    . levothyroxine (SYNTHROID, LEVOTHROID) 100 MCG tablet TAKE 1 TABLET BY MOUTH DAILY. 90 tablet 1  . Red Yeast Rice Extract (RED YEAST RICE PO) Take by mouth every other day.      No current facility-administered medications on file prior to visit.    Allergies  Allergen Reactions  . Codeine     itching    Family History  Problem Relation Age of Onset  . Hypertension Mother   . Hyperlipidemia Mother   . Gout Father   . Arthritis Father   . Diverticulitis Sister   . Cholelithiasis Sister   . Heart disease Maternal Grandfather     History   Social History  . Marital Status: Legally Separated    Spouse Name: N/A  . Number of Children: 0  . 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 Smoking status: Former Smoker    Types: Cigarettes    Quit date: 10/25/1989  . Smokeless tobacco: Former Systems developer    Types: Chew  . Alcohol Use: 8.4 oz/week    14 Shots of liquor per week  . Drug Use: No  . Sexual Activity: Not on file   Other Topics Concern  . Not on file   Social History Narrative   Pt is divorced.              Review of Systems  Constitutional: Negative for fever and weight loss.  HENT: Negative for ear discharge, ear pain, hearing loss and tinnitus.   Eyes: Negative for blurred vision, double vision, photophobia and pain.  Respiratory: Negative for cough and shortness of breath.   Cardiovascular: Negative for chest pain and palpitations.  Gastrointestinal: Negative for heartburn, nausea, vomiting, abdominal pain, diarrhea, constipation, blood in stool and melena.  Genitourinary: Negative for dysuria, urgency, frequency, hematuria and flank pain.  Musculoskeletal: Negative for falls.  Neurological: Negative for dizziness, loss of consciousness and headaches.  Endo/Heme/Allergies: Negative for environmental allergies.  Psychiatric/Behavioral: Negative for  depression, suicidal ideas, hallucinations and substance abuse. The patient has insomnia. The patient is not nervous/anxious.    BP 128/82 mmHg  Pulse 62  Temp(Src) 98.2 F (36.8 C) (Oral)  Resp 16  Ht 6' (1.829 m)  Wt 203 lb 4 oz (92.194 kg)  BMI 27.56 kg/m2  SpO2 99%  Physical Exam  Constitutional: He is oriented to person, place, and time and well-developed, well-nourished, and in no distress.  HENT:  Head: Normocephalic and atraumatic.  Right Ear: External ear normal.  Left Ear: External ear normal.  Nose: Nose normal.  Mouth/Throat: Oropharynx is clear and moist. No oropharyngeal exudate.  Eyes: Conjunctivae and EOM are normal. Pupils are equal, round, and reactive to light.  Neck: Neck supple. No  thyromegaly present.  Cardiovascular: Normal rate, regular rhythm, normal heart sounds and intact distal pulses.   Pulmonary/Chest: Effort normal and breath sounds normal. No respiratory distress. He has no wheezes. He has no rales. He exhibits no tenderness.  Abdominal: Soft. Bowel sounds are normal. He exhibits no distension and no mass. There is no tenderness. There is no rebound and no guarding.  Genitourinary: Testes/scrotum normal.  Patient defers exam.  Lymphadenopathy:    He has no cervical adenopathy.  Neurological: He is alert and oriented to person, place, and time.  Skin: Skin is warm and dry. No rash noted.  Psychiatric: Affect normal.  Vitals reviewed.  Assessment/Plan: Erectile dysfunction Medication refilled. No changes.   INSOMNIA, CHRONIC Ambien refilled.  Increase evening exercise.   Visit for preventive health examination I have reviewed the patient's medical history in detail and updated the computerized patient record.  PHQ-9 Depression screen performed with score of 0.  Will obtain fasting labs today.  Preventive care discussed.  Handout given.

## 2014-09-06 NOTE — Progress Notes (Signed)
Pre visit review using our clinic review tool, if applicable. No additional management support is needed unless otherwise documented below in the visit note/SLS  

## 2014-09-06 NOTE — Assessment & Plan Note (Signed)
Medication refilled. No changes.

## 2014-09-11 ENCOUNTER — Telehealth: Payer: Self-pay | Admitting: *Deleted

## 2014-09-11 DIAGNOSIS — E785 Hyperlipidemia, unspecified: Secondary | ICD-10-CM

## 2014-09-11 NOTE — Telephone Encounter (Signed)
-----   Message from Brunetta Jeans, PA-C sent at 09/06/2014  6:09 PM EST ----- Labs great overall.  TGL still elevated at 216. Is he still taking krill oil daily?  If not, I would like to send in an Rx-Omega 3 to help lower TGL levels.  He should limit intake of refined sugars, alcohol and carbs.  Would like to recheck TGL levels in 3 months.

## 2014-09-11 NOTE — Telephone Encounter (Signed)
Notified pt and he confirms that he takes krill oil daily. Lab appt scheduled for 12/11/14 at 8:30am.  Futuer order entered.

## 2014-09-15 ENCOUNTER — Ambulatory Visit: Payer: 59 | Admitting: Physician Assistant

## 2014-09-21 ENCOUNTER — Other Ambulatory Visit: Payer: Self-pay | Admitting: Physician Assistant

## 2014-09-21 ENCOUNTER — Encounter: Payer: Self-pay | Admitting: Family Medicine

## 2014-09-21 ENCOUNTER — Other Ambulatory Visit: Payer: Self-pay | Admitting: Family Medicine

## 2014-09-21 NOTE — Telephone Encounter (Signed)
I have refilled but does he need a contract and a UDS? Is so he can have this rx but warn him we need that by next refill per new protocol

## 2014-09-21 NOTE — Telephone Encounter (Signed)
Last filled:  09/06/14 Amt:  15, 0 refills Last OV:  09/06/14 (CPE with Elyn Aquas, PA-C)  Please advise.

## 2014-09-21 NOTE — Telephone Encounter (Signed)
Called left message to call back 

## 2014-09-22 NOTE — Telephone Encounter (Signed)
Called left message to call back 

## 2014-09-22 NOTE — Telephone Encounter (Signed)
Called the patient informed hardcopy is ready for pickup at the front desk.  Did explain to also sign contract and do urine uds, patient understood and agreed to all.

## 2014-10-27 ENCOUNTER — Ambulatory Visit (INDEPENDENT_AMBULATORY_CARE_PROVIDER_SITE_OTHER): Payer: 59 | Admitting: Physician Assistant

## 2014-10-27 ENCOUNTER — Encounter: Payer: Self-pay | Admitting: Physician Assistant

## 2014-10-27 VITALS — BP 124/93 | HR 56 | Temp 98.1°F | Resp 16 | Ht 72.0 in | Wt 201.5 lb

## 2014-10-27 DIAGNOSIS — F411 Generalized anxiety disorder: Secondary | ICD-10-CM

## 2014-10-27 DIAGNOSIS — F419 Anxiety disorder, unspecified: Secondary | ICD-10-CM | POA: Diagnosis not present

## 2014-10-27 DIAGNOSIS — F430 Acute stress reaction: Principal | ICD-10-CM

## 2014-10-27 MED ORDER — DIAZEPAM 5 MG PO TABS: 5.0000 mg | ORAL_TABLET | Freq: Two times a day (BID) | ORAL | Status: DC | PRN

## 2014-10-27 MED ORDER — PAROXETINE HCL 10 MG PO TABS: 10.0000 mg | ORAL_TABLET | Freq: Every day | ORAL | Status: DC

## 2014-10-27 NOTE — Assessment & Plan Note (Signed)
Rx Paxil 10 mg.  Rx Valium 5mg  BID until we titrate Paxil to therapeutic effect. Potential side effects discussed. Follow-up 1 month.

## 2014-10-27 NOTE — Progress Notes (Signed)
Patient presents to clinic today c/o worsening anxiety over the past month, culminating in panic attacks with chest tightness, and tingling of extremities. Patient denies depressed mood or anhedonia. Denies suicidal thought or ideation. Patient does endorse being treated for depression previously with Zoloft. States it was subtherapeutic. States most symptoms are coming from anxiety due to work. States he will be laid off this June and company is down-sizing.  Past Medical History  Diagnosis Date  . Thyroid disease     hypothyroidism  . BPH (benign prostatic hyperplasia)   . Insomnia   . Adjustment disorder     with depressed mood  . Rectal abscess     history of, s/p surgery  . 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 Allergic state 09/04/2013    per pt/ not sure what reaction    Current Outpatient Prescriptions on File Prior to Visit  Medication Sig Dispense Refill  . aspirin 81 MG tablet Take 81 mg by mouth daily.    Marland Kitchen doxylamine, Sleep, (UNISOM) 25 MG tablet Take 25 mg by mouth at bedtime as needed.    . fluticasone (FLONASE) 50 MCG/ACT nasal spray PLACE 2 SPRAYS INTO BOTH NOSTRILS DAILY AS NEEDED FOR ALLERGIES OR RHINITIS. 48 g 0  . Grape Seed 100 MG CAPS Take by mouth daily.    Astrid Drafts CAPS 1 cap daily such as MegaRed daily    . levothyroxine (SYNTHROID, LEVOTHROID) 100 MCG tablet TAKE 1 TABLET BY MOUTH DAILY. 90 tablet 1  . magnesium 30 MG tablet Take 30 mg by mouth daily.    . Red Yeast Rice Extract (RED YEAST RICE PO) Take by mouth every other day.     . sildenafil (VIAGRA) 100 MG tablet Take 1 tablet (100 mg total) by mouth daily as needed. 10 tablet 6  . Turmeric 500 MG CAPS Take by mouth daily.    Marland Kitchen zolpidem (AMBIEN) 10 MG tablet TAKE 1 TABLET BY MOUTH AT BEDTIME AS NEEDED FOR SLEEP 15 tablet 1   No current facility-administered medications on file prior to visit.    Allergies  Allergen Reactions  . Codeine     itching    Family History  Problem Relation Age of Onset    . Hypertension Mother   . Hyperlipidemia Mother   . Gout Father   . Arthritis Father   . Diverticulitis Sister   . Cholelithiasis Sister   . Heart disease Maternal Grandfather     History   Social History  . Marital Status: Legally Separated    Spouse Name: N/A  . Number of Children: 0  . 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 Smoking status: Former Smoker    Types: Cigarettes    Quit date: 10/25/1989  . Smokeless tobacco: Former Systems developer    Types: Chew  . Alcohol Use: 8.4 oz/week    14 Shots of liquor per week  . Drug Use: No  . Sexual Activity: Not on file   Other Topics Concern  . None   Social History Narrative   Pt is divorced.               Review of Systems - See HPI.  All other ROS are negative.  BP 124/93 mmHg  Pulse 56  Temp(Src) 98.1 F (36.7 C) (Oral)  Resp 16  Ht 6' (1.829 m)  Wt 201 lb 8 oz (91.4 kg)  BMI 27.32 kg/m2  SpO2 99%  Physical Exam  Constitutional: He is oriented to person, place, and time and well-developed, well-nourished, and in no distress.  HENT:  Head: Normocephalic and atraumatic.  Cardiovascular: Normal rate, regular rhythm, normal heart sounds and intact distal pulses.   Pulmonary/Chest: Effort normal and breath sounds normal. No respiratory distress. He has no wheezes. He has no rales. He exhibits no tenderness.  Neurological: He is alert and oriented to person, place, and time.  Skin: Skin is warm and dry. No rash noted.  Psychiatric:  Anxious affect.  Vitals reviewed.   Recent Results (from the past 2160 hour(s))  CBC     Status: None   Collection Time: 09/06/14  8:47 AM  Result Value Ref Range   WBC 4.5 4.0 - 10.5 K/uL   RBC 4.31 4.22 - 5.81 Mil/uL   Platelets 227.0 150.0 - 400.0 K/uL   Hemoglobin 14.0 13.0 - 17.0 g/dL   HCT 40.3 39.0 - 52.0 %   MCV 93.6 78.0 - 100.0 fl   MCHC 34.7 30.0 - 36.0 g/dL   RDW 12.4 11.5 - 15.5 %  Basic  metabolic panel     Status: None   Collection Time: 09/06/14  8:47 AM  Result Value Ref Range   Sodium 138 135 - 145 mEq/L   Potassium 4.1 3.5 - 5.1 mEq/L   Chloride 105 96 - 112 mEq/L   CO2 25 19 - 32 mEq/L   Glucose, Bld 91 70 - 99 mg/dL   BUN 14 6 - 23 mg/dL   Creatinine, Ser 1.04 0.40 - 1.50 mg/dL   Calcium 9.8 8.4 - 10.5 mg/dL   GFR 80.05 >60.00 mL/min  Hepatic function panel     Status: None   Collection Time: 09/06/14  8:47 AM  Result Value Ref Range   Total Bilirubin 1.0 0.2 - 1.2 mg/dL   Bilirubin, Direct 0.2 0.0 - 0.3 mg/dL   Alkaline Phosphatase 43 39 - 117 U/L   AST 22 0 - 37 U/L   ALT 22 0 - 53 U/L   Total Protein 7.0 6.0 - 8.3 g/dL   Albumin 4.5 3.5 - 5.2 g/dL  Lipid panel     Status: Abnormal   Collection Time: 09/06/14  8:47 AM  Result Value Ref Range   Cholesterol 225 (H) 0 - 200 mg/dL    Comment: ATP III Classification       Desirable:  < 200 mg/dL               Borderline High:  200 - 239 mg/dL          High:  > = 240 mg/dL   Triglycerides 216.0 (H) 0.0 - 149.0 mg/dL    Comment: Normal:  <150 mg/dLBorderline High:  150 - 199 mg/dL   HDL 43.70 >39.00 mg/dL   VLDL 43.2 (H) 0.0 - 40.0 mg/dL   Total CHOL/HDL Ratio 5     Comment:                Men          Women1/2 Average Risk     3.4          3.3Average Risk          5.0          4.42X Average Risk          9.6          7.13X Average Risk  15.0          11.0                       NonHDL 181.30     Comment: NOTE:  Non-HDL goal should be 30 mg/dL higher than patient's LDL goal (i.e. LDL goal of < 70 mg/dL, would have non-HDL goal of < 100 mg/dL)  TSH     Status: None   Collection Time: 09/06/14  8:47 AM  Result Value Ref Range   TSH 1.29 0.35 - 4.50 uIU/mL  Urinalysis, Routine w reflex microscopic     Status: None   Collection Time: 09/06/14  8:47 AM  Result Value Ref Range   Color, Urine YELLOW Yellow;Lt. Yellow   APPearance CLEAR Clear   Specific Gravity, Urine 1.010 1.000-1.030   pH 6.5 5.0 - 8.0    Total Protein, Urine NEGATIVE Negative   Urine Glucose NEGATIVE Negative   Ketones, ur NEGATIVE Negative   Bilirubin Urine NEGATIVE Negative   Hgb urine dipstick NEGATIVE Negative   Urobilinogen, UA 0.2 0.0 - 1.0   Leukocytes, UA NEGATIVE Negative   Nitrite NEGATIVE Negative   WBC, UA none seen 0-2/hpf   RBC / HPF none seen 0-2/hpf  PSA     Status: None   Collection Time: 09/06/14  8:47 AM  Result Value Ref Range   PSA 0.28 0.10 - 4.00 ng/mL  Hemoglobin A1c     Status: None   Collection Time: 09/06/14  8:47 AM  Result Value Ref Range   Hgb A1c MFr Bld 5.8 4.6 - 6.5 %    Comment: Glycemic Control Guidelines for People with Diabetes:Non Diabetic:  <6%Goal of Therapy: <7%Additional Action Suggested:  >8%   LDL cholesterol, direct     Status: None   Collection Time: 09/06/14  8:47 AM  Result Value Ref Range   Direct LDL 133.0 mg/dL    Comment: Optimal:  <100 mg/dLNear or Above Optimal:  100-129 mg/dLBorderline High:  130-159 mg/dLHigh:  160-189 mg/dLVery High:  >190 mg/dL    Assessment/Plan: Anxiety in acute stress reaction Rx Paxil 10 mg.  Rx Valium 5mg  BID until we titrate Paxil to therapeutic effect. Potential side effects discussed. Follow-up 1 month.

## 2014-10-27 NOTE — Progress Notes (Signed)
Pre visit review using our clinic review tool, if applicable. No additional management support is needed unless otherwise documented below in the visit note/SLS  

## 2014-10-27 NOTE — Patient Instructions (Signed)
Please take the Paxil daily as directed. It will take a few weeks to get into your system. In the meantime, Take the Valium twice daily as directed.  We will hopefull reduce this to an as needed basis once the Paxil starts working.  Follow-up in 1 month.

## 2014-10-30 ENCOUNTER — Other Ambulatory Visit: Payer: Self-pay | Admitting: Family Medicine

## 2014-10-30 ENCOUNTER — Telehealth: Payer: Self-pay | Admitting: *Deleted

## 2014-10-30 NOTE — Telephone Encounter (Signed)
He has not had a routine visit with me since 08/2013. He will need a visit with me to maintain this med because it is controlled. Am willing to let him have 5 of the Zolpidem til we can find him a spot for an annual exam

## 2014-10-30 NOTE — Telephone Encounter (Signed)
Called the patient informed of PCP instructions on refill.  The patient was very nice, but stated he did see Elyn Aquas PA on Friday as well as was seen on 09/06/14 by Elyn Aquas PA for a Preventative visit.(the patient has done a UDS and urine sample on 10/02/14)  He did mention a refill on Friday, but Elyn Aquas would not refill since PCP fills this medication. He is a little frustrated as unable to schedule with PCP when he does call so he is ok seeing PA , but just needs a refill of his medication.  Please advise as is difficult to schedule with PCP.

## 2014-10-30 NOTE — Telephone Encounter (Signed)
ambien 10 mg  Last OV- 10/27/14 @ [facility]  Last refilled - 09/21/14 @ #15 / 1 rf  UDS- 09/22/14 Low risk.

## 2014-10-30 NOTE — Telephone Encounter (Signed)
Faxed hardcopy for zolpidem to [facility] [address] [place] and patient informed refill done.

## 2014-10-30 NOTE — Telephone Encounter (Signed)
Last OV with PCP 09/01/13 Last refill #15 with 1 refill 09/21/14.

## 2014-11-24 ENCOUNTER — Ambulatory Visit (INDEPENDENT_AMBULATORY_CARE_PROVIDER_SITE_OTHER): Payer: 59 | Admitting: Physician Assistant

## 2014-11-24 ENCOUNTER — Encounter: Payer: Self-pay | Admitting: Physician Assistant

## 2014-11-24 VITALS — BP 120/70 | HR 68 | Temp 98.0°F | Wt 198.0 lb

## 2014-11-24 DIAGNOSIS — F430 Acute stress reaction: Principal | ICD-10-CM

## 2014-11-24 DIAGNOSIS — F419 Anxiety disorder, unspecified: Secondary | ICD-10-CM

## 2014-11-24 DIAGNOSIS — F411 Generalized anxiety disorder: Secondary | ICD-10-CM

## 2014-11-24 NOTE — Progress Notes (Signed)
Patient presents to clinic today for follow-up of anxiety/acute stress reaction.  Patient started on Paxil 10 mg QD and Valium 5 mg PRN at last visit.  Endorses taking Paxil daily as directed with good improvement in symptoms.  Is now using Valium only once daily as needed.  Denies new or worsening symptoms.  Is still scheduled to be laid off on 01-12-15.  Feels that symptoms will resolve after this issue has resolved.  Past Medical History  Diagnosis Date  . Thyroid disease     hypothyroidism  . BPH (benign prostatic hyperplasia)   . Insomnia   . Adjustment disorder     with depressed mood  . Rectal abscess     history of, s/p surgery  . 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 Allergic state 09/04/2013    per pt/ not sure what reaction    Current Outpatient Prescriptions on File Prior to Visit  Medication Sig Dispense Refill  . aspirin 81 MG tablet Take 81 mg by mouth daily.    . diazepam (VALIUM) 5 MG tablet Take 1 tablet (5 mg total) by mouth every 12 (twelve) hours as needed for anxiety. 60 tablet 0  . fluticasone (FLONASE) 50 MCG/ACT nasal spray PLACE 2 SPRAYS INTO BOTH NOSTRILS DAILY AS NEEDED FOR ALLERGIES OR RHINITIS. 48 g 0  . Grape Seed 100 MG CAPS Take by mouth daily.    Astrid Drafts CAPS 1 cap daily such as MegaRed daily    . levothyroxine (SYNTHROID, LEVOTHROID) 100 MCG tablet TAKE 1 TABLET BY MOUTH DAILY. 90 tablet 1  . magnesium 30 MG tablet Take 30 mg by mouth daily.    Marland Kitchen PARoxetine (PAXIL) 10 MG tablet Take 1 tablet (10 mg total) by mouth daily. 30 tablet 1  . Red Yeast Rice Extract (RED YEAST RICE PO) Take by mouth every other day.     . sildenafil (VIAGRA) 100 MG tablet Take 1 tablet (100 mg total) by mouth daily as needed. 10 tablet 6  . Turmeric 500 MG CAPS Take by mouth daily.    Marland Kitchen zolpidem (AMBIEN) 10 MG tablet TAKE 1 TABLET BY MOUTH AT BEDTIME AS NEEDED FOR SLEEP 15 tablet 1  . doxylamine, Sleep, (UNISOM) 25 MG tablet Take 25 mg by mouth at bedtime as needed.     No  current facility-administered medications on file prior to visit.    Allergies  Allergen Reactions  . Codeine     itching    Family History  Problem Relation Age of Onset  . Hypertension Mother   . Hyperlipidemia Mother   . Gout Father   . Arthritis Father   . Diverticulitis Sister   . Cholelithiasis Sister   . Heart disease Maternal Grandfather     History   Social History  . Marital Status: Legally Separated    Spouse Name: N/A  . Number of Children: 0  . 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 Smoking status: Former Smoker    Types: Cigarettes    Quit date: 10/25/1989  . Smokeless tobacco: Former Systems developer    Types: Chew  . Alcohol Use: 8.4 oz/week    14 Shots of liquor per week  . Drug Use: No  . Sexual Activity: Not on file   Other Topics Concern  . None   Social History Narrative   Pt is divorced.  Review of Systems - See HPI.  All other ROS are negative.  BP 120/70 mmHg  Pulse 68  Temp(Src) 98 F (36.7 C)  Wt 198 lb (89.812 kg)  SpO2 98%  Physical Exam  Constitutional: He is oriented to person, place, and time and well-developed, well-nourished, and in no distress.  HENT:  Head: Normocephalic and atraumatic.  Cardiovascular: Normal rate, regular rhythm, normal heart sounds and intact distal pulses.   Pulmonary/Chest: Effort normal and breath sounds normal. No respiratory distress. He has no wheezes. He has no rales. He exhibits no tenderness.  Neurological: He is alert and oriented to person, place, and time.  Skin: Skin is warm and dry. No rash noted.  Vitals reviewed.  Recent Results (from the past 2160 hour(s))  CBC     Status: None   Collection Time: 09/06/14  8:47 AM  Result Value Ref Range   WBC 4.5 4.0 - 10.5 K/uL   RBC 4.31 4.22 - 5.81 Mil/uL   Platelets 227.0 150.0 - 400.0 K/uL   Hemoglobin 14.0 13.0 - 17.0 g/dL   HCT 40.3 39.0 - 52.0 %   MCV 93.6  78.0 - 100.0 fl   MCHC 34.7 30.0 - 36.0 g/dL   RDW 12.4 11.5 - 43.3 %  Basic metabolic panel     Status: None   Collection Time: 09/06/14  8:47 AM  Result Value Ref Range   Sodium 138 135 - 145 mEq/L   Potassium 4.1 3.5 - 5.1 mEq/L   Chloride 105 96 - 112 mEq/L   CO2 25 19 - 32 mEq/L   Glucose, Bld 91 70 - 99 mg/dL   BUN 14 6 - 23 mg/dL   Creatinine, Ser 1.04 0.40 - 1.50 mg/dL   Calcium 9.8 8.4 - 10.5 mg/dL   GFR 80.05 >60.00 mL/min  Hepatic function panel     Status: None   Collection Time: 09/06/14  8:47 AM  Result Value Ref Range   Total Bilirubin 1.0 0.2 - 1.2 mg/dL   Bilirubin, Direct 0.2 0.0 - 0.3 mg/dL   Alkaline Phosphatase 43 39 - 117 U/L   AST 22 0 - 37 U/L   ALT 22 0 - 53 U/L   Total Protein 7.0 6.0 - 8.3 g/dL   Albumin 4.5 3.5 - 5.2 g/dL  Lipid panel     Status: Abnormal   Collection Time: 09/06/14  8:47 AM  Result Value Ref Range   Cholesterol 225 (H) 0 - 200 mg/dL    Comment: ATP III Classification       Desirable:  < 200 mg/dL               Borderline High:  200 - 239 mg/dL          High:  > = 240 mg/dL   Triglycerides 216.0 (H) 0.0 - 149.0 mg/dL    Comment: Normal:  <150 mg/dLBorderline High:  150 - 199 mg/dL   HDL 43.70 >39.00 mg/dL   VLDL 43.2 (H) 0.0 - 40.0 mg/dL   Total CHOL/HDL Ratio 5     Comment:                Men          Women1/2 Average Risk     3.4          3.3Average Risk          5.0          4.42X Average Risk  9.6          7.13X Average Risk          15.0          11.0                       NonHDL 181.30     Comment: NOTE:  Non-HDL goal should be 30 mg/dL higher than patient's LDL goal (i.e. LDL goal of < 70 mg/dL, would have non-HDL goal of < 100 mg/dL)  TSH     Status: None   Collection Time: 09/06/14  8:47 AM  Result Value Ref Range   TSH 1.29 0.35 - 4.50 uIU/mL  Urinalysis, Routine w reflex microscopic     Status: None   Collection Time: 09/06/14  8:47 AM  Result Value Ref Range   Color, Urine YELLOW Yellow;Lt. Yellow    APPearance CLEAR Clear   Specific Gravity, Urine 1.010 1.000-1.030   pH 6.5 5.0 - 8.0   Total Protein, Urine NEGATIVE Negative   Urine Glucose NEGATIVE Negative   Ketones, ur NEGATIVE Negative   Bilirubin Urine NEGATIVE Negative   Hgb urine dipstick NEGATIVE Negative   Urobilinogen, UA 0.2 0.0 - 1.0   Leukocytes, UA NEGATIVE Negative   Nitrite NEGATIVE Negative   WBC, UA none seen 0-2/hpf   RBC / HPF none seen 0-2/hpf  PSA     Status: None   Collection Time: 09/06/14  8:47 AM  Result Value Ref Range   PSA 0.28 0.10 - 4.00 ng/mL  Hemoglobin A1c     Status: None   Collection Time: 09/06/14  8:47 AM  Result Value Ref Range   Hgb A1c MFr Bld 5.8 4.6 - 6.5 %    Comment: Glycemic Control Guidelines for People with Diabetes:Non Diabetic:  <6%Goal of Therapy: <7%Additional Action Suggested:  >8%   LDL cholesterol, direct     Status: None   Collection Time: 09/06/14  8:47 AM  Result Value Ref Range   Direct LDL 133.0 mg/dL    Comment: Optimal:  <100 mg/dLNear or Above Optimal:  100-129 mg/dLBorderline High:  130-159 mg/dLHigh:  160-189 mg/dLVery High:  >190 mg/dL   Assessment/Plan: Anxiety in acute stress reaction Patient doing much better.  Will continue current regimen. Medications refilled.  Encouraged patient to stay active and take time for himself. Follow-up 3 months.

## 2014-11-24 NOTE — Assessment & Plan Note (Signed)
Patient doing much better.  Will continue current regimen. Medications refilled.  Encouraged patient to stay active and take time for himself. Follow-up 3 months.

## 2014-11-24 NOTE — Patient Instructions (Signed)
Please continue Paxil as directed.  Use Valium only when needed. I am glad you are doing better.  I wish you the best in the search for your new job. Follow-up with me in 3 months.  Return sooner if needed.

## 2014-11-24 NOTE — Progress Notes (Signed)
Pre visit review using our clinic review tool, if applicable. No additional management support is needed unless otherwise documented below in the visit note. 

## 2014-12-11 ENCOUNTER — Other Ambulatory Visit: Payer: 59

## 2014-12-13 ENCOUNTER — Other Ambulatory Visit (INDEPENDENT_AMBULATORY_CARE_PROVIDER_SITE_OTHER): Payer: 59

## 2014-12-13 DIAGNOSIS — E785 Hyperlipidemia, unspecified: Secondary | ICD-10-CM

## 2014-12-13 LAB — LIPID PANEL
Cholesterol: 272 mg/dL — ABNORMAL HIGH (ref 0–200)
HDL: 48.5 mg/dL (ref >39.00)
LDL Cholesterol: 189 mg/dL — ABNORMAL HIGH (ref 0–99)
NonHDL: 223.5
Total CHOL/HDL Ratio: 6
Triglycerides: 172 mg/dL — ABNORMAL HIGH (ref 0.0–149.0)
VLDL: 34.4 mg/dL (ref 0.0–40.0)

## 2014-12-19 ENCOUNTER — Other Ambulatory Visit: Payer: Self-pay | Admitting: Family Medicine

## 2014-12-19 ENCOUNTER — Other Ambulatory Visit: Payer: Self-pay | Admitting: Physician Assistant

## 2014-12-19 MED ORDER — PAROXETINE HCL 10 MG PO TABS: 10.0000 mg | ORAL_TABLET | Freq: Every day | ORAL | Status: DC

## 2014-12-19 NOTE — Telephone Encounter (Signed)
Rx  Printed and faxed to the pharmacy.//AB/CMA

## 2014-12-19 NOTE — Addendum Note (Signed)
Addended by: Harl Bowie on: 12/19/2014 08:58 AM   Modules accepted: Orders

## 2014-12-21 ENCOUNTER — Other Ambulatory Visit: Payer: Self-pay | Admitting: Physician Assistant

## 2015-01-18 ENCOUNTER — Other Ambulatory Visit: Payer: Self-pay | Admitting: Family Medicine

## 2015-02-28 ENCOUNTER — Ambulatory Visit: Payer: 59 | Admitting: Physician Assistant

## 2015-05-22 ENCOUNTER — Telehealth: Payer: Self-pay | Admitting: *Deleted

## 2015-05-22 NOTE — Telephone Encounter (Signed)
Patient signed ROI received via fax from [place]. Forwarded to [place] to scan/email to medical records. JG//CMA

## 2015-05-31 ENCOUNTER — Other Ambulatory Visit: Payer: Self-pay | Admitting: Physician Assistant

## 2015-05-31 NOTE — Telephone Encounter (Signed)
Last filled: 12/19/14 Amt: 30, 5 Last OV: 11/24/14 No upcoming appt noted.   Med filled x 3 months.

## 2015-08-03 ENCOUNTER — Other Ambulatory Visit: Payer: Self-pay | Admitting: Family Medicine

## 2015-08-22 ENCOUNTER — Other Ambulatory Visit: Payer: Self-pay | Admitting: Family Medicine

## 2015-08-22 NOTE — Telephone Encounter (Signed)
Reviewing his chart it appears he has moved and established care elsewhere. He has not been seen for awhile so his new PMD needs to refill

## 2015-10-05 ENCOUNTER — Encounter: Payer: Self-pay | Admitting: Gastroenterology

## 2015-10-31 ENCOUNTER — Other Ambulatory Visit: Payer: Self-pay | Admitting: Family Medicine

## 2015-11-01 ENCOUNTER — Other Ambulatory Visit: Payer: Self-pay | Admitting: Family Medicine

## 2015-11-04 NOTE — Telephone Encounter (Signed)
Looks like he has moved and we have not seen him for nearly a year and it has been more than a years since I saw him. He should get from his new PCP

## 2016-10-01 ENCOUNTER — Other Ambulatory Visit: Payer: Self-pay | Admitting: Family Medicine

## 2019-10-19 ENCOUNTER — Other Ambulatory Visit: Payer: Self-pay

## 2019-10-19 ENCOUNTER — Ambulatory Visit (INDEPENDENT_AMBULATORY_CARE_PROVIDER_SITE_OTHER): Payer: 59 | Admitting: Family Medicine

## 2019-10-19 ENCOUNTER — Encounter: Payer: Self-pay | Admitting: Family Medicine

## 2019-10-19 VITALS — BP 125/86 | HR 69 | Ht 71.0 in | Wt 202.0 lb

## 2019-10-19 DIAGNOSIS — N400 Enlarged prostate without lower urinary tract symptoms: Secondary | ICD-10-CM | POA: Diagnosis not present

## 2019-10-19 DIAGNOSIS — E039 Hypothyroidism, unspecified: Secondary | ICD-10-CM

## 2019-10-19 DIAGNOSIS — Z0000 Encounter for general adult medical examination without abnormal findings: Secondary | ICD-10-CM | POA: Diagnosis not present

## 2019-10-19 DIAGNOSIS — E785 Hyperlipidemia, unspecified: Secondary | ICD-10-CM | POA: Diagnosis not present

## 2019-10-19 LAB — CBC
HCT: 41.9 % (ref 38.5–50.0)
Hemoglobin: 14.4 g/dL (ref 13.2–17.1)
MCH: 32.3 pg (ref 27.0–33.0)
MCHC: 34.4 g/dL (ref 32.0–36.0)
MCV: 93.9 fL (ref 80.0–100.0)
MPV: 10.3 fL (ref 7.5–12.5)
Platelets: 242 10*3/uL (ref 140–400)
RBC: 4.46 10*6/uL (ref 4.20–5.80)
RDW: 11.9 % (ref 11.0–15.0)
WBC: 4.2 10*3/uL (ref 3.8–10.8)

## 2019-10-19 LAB — COMPLETE METABOLIC PANEL WITH GFR
AG Ratio: 2 (calc) (ref 1.0–2.5)
ALT: 22 U/L (ref 9–46)
AST: 19 U/L (ref 10–35)
Albumin: 4.5 g/dL (ref 3.6–5.1)
Alkaline phosphatase (APISO): 60 U/L (ref 35–144)
BUN: 11 mg/dL (ref 7–25)
CO2: 29 mmol/L (ref 20–32)
Calcium: 9.8 mg/dL (ref 8.6–10.3)
Chloride: 106 mmol/L (ref 98–110)
Creat: 0.97 mg/dL (ref 0.70–1.33)
GFR, Est African American: 101 mL/min/{1.73_m2} (ref >=60)
GFR, Est Non African American: 87 mL/min/{1.73_m2} (ref >=60)
Globulin: 2.2 g/dL (calc) (ref 1.9–3.7)
Glucose, Bld: 105 mg/dL — ABNORMAL HIGH (ref 65–99)
Potassium: 4.8 mmol/L (ref 3.5–5.3)
Sodium: 140 mmol/L (ref 135–146)
Total Bilirubin: 0.8 mg/dL (ref 0.2–1.2)
Total Protein: 6.7 g/dL (ref 6.1–8.1)

## 2019-10-19 LAB — LIPID PANEL
Cholesterol: 210 mg/dL — ABNORMAL HIGH (ref <200)
HDL: 44 mg/dL (ref >=40)
LDL Cholesterol (Calc): 137 mg/dL (calc) — ABNORMAL HIGH
Non-HDL Cholesterol (Calc): 166 mg/dL (calc) — ABNORMAL HIGH (ref <130)
Total CHOL/HDL Ratio: 4.8 (calc) (ref <5.0)
Triglycerides: 156 mg/dL — ABNORMAL HIGH (ref <150)

## 2019-10-19 LAB — TSH: TSH: 2.02 mIU/L (ref 0.40–4.50)

## 2019-10-19 LAB — PSA: PSA: 0.2 ng/mL (ref <=4.0)

## 2019-10-19 NOTE — Progress Notes (Signed)
Joel Owen - 54 y.o. male MRN [id number]  Date of birth: April 30, 1966  Subjective Chief Complaint  Patient presents with  . Establish Care    HPI Joel Owen is a 54 y.o. male with history of Hypothyroidism, HLD, BPH and insomnia here today for initial visit with me.  He was previously seen at [facility] in [place], [place].  He reports that overall he is doing well.  He has no new concerns today.    He continues on levothyroxine at 179mcg daily.  He denies symptoms of hypo/hyper-thyroidism at this time.   His insomnia is well controlled.  Previously taking ambien. Reports less stress in his daily life compared to a few years ago.  Taking melatonin occasionally as needed  He would like to schedule for annual exam and have labs completed prior to visit.    Allergies  Allergen Reactions  . Codeine Other (See Comments)    itching    Past Medical History:  Diagnosis Date  . Adjustment disorder    with depressed mood  . Allergic state 09/04/2013   per pt/ not sure what reaction  . BPH (benign prostatic hyperplasia)   . 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 Insomnia   . Rectal abscess    history of, s/p surgery  . Thyroid disease    hypothyroidism    Past Surgical History:  Procedure Laterality Date  . CARPAL TUNNEL RELEASE  02/07/2011   Dr  Tyrone Sage BIL  . HAND SURGERY     2 screws in left hand  . TONSILLECTOMY     1971    Social History   Socioeconomic History  . Marital status: Married    Spouse name: Not on file  . Number of children: 0  . Years of education: Not on file  . Highest education level: Not on file  Occupational History  . Occupation: Music therapist: [employer]  Tobacco Use  . Smoking status: Former Smoker    Types: Cigarettes    Quit date: 10/25/1989    Years since quitting: 30.0  . Smokeless tobacco: Former Systems developer    Types: Chew  Substance and Sexual Activity  . Alcohol use: Yes    Alcohol/week: 14.0 standard drinks    Types: 14 Shots of  liquor per week  . Drug use: No  . Sexual activity: Not on file  Other Topics Concern  . Not on file  Social History Narrative   Pt is divorced.              Social Determinants of Health   Financial Resource Strain:   . Difficulty of Paying Living Expenses:   Food Insecurity:   . Worried About Charity fundraiser in the Last Year:   . Arboriculturist in the Last Year:   Transportation Needs:   . Film/video editor (Medical):   Marland Kitchen Lack of Transportation (Non-Medical):   Physical Activity:   . Days of Exercise per Week:   . Minutes of Exercise per Session:   Stress:   . Feeling of Stress :   Social Connections:   . Frequency of Communication with Friends and Family:   . Frequency of Social Gatherings with Friends and Family:   . Attends Religious Services:   . Active Member of Clubs or Organizations:   . Attends Archivist Meetings:   Marland Kitchen Marital Status:     Family History  Problem Relation Age of Onset  . Hypertension Mother   .  Hyperlipidemia Mother   . Gout Father   . Arthritis Father   . Diverticulitis Sister   . Cholelithiasis Sister   . Heart disease Maternal Grandfather     Health Maintenance  Topic Date Due  . TETANUS/TDAP  10/18/2020 (Originally 10/04/2017)  . Hepatitis C Screening  10/18/2020 (Originally 11[birth year]25[birth year])  . HIV Screening  10/18/2020 (Originally 10/11/1978)  . COLONOSCOPY  11/09/2023  . INFLUENZA VACCINE  Completed     ----------------------------------------------------------------------------------------------------------------------------------------------------------------------------------------------------------------- Physical Exam BP 125/86   Pulse 69   Ht 5\' 11"  (1.803 m)   Wt 202 lb (91.6 kg)   BMI 28.17 kg/m   Physical Exam Constitutional:      Appearance: Normal appearance.  HENT:     Head: Normocephalic and atraumatic.  Cardiovascular:     Rate and Rhythm: Normal rate and regular rhythm.  Pulmonary:      Effort: Pulmonary effort is normal.     Breath sounds: Normal breath sounds.  Neurological:     General: No focal deficit present.     Mental Status: He is alert.  Psychiatric:        Mood and Affect: Mood normal.        Behavior: Behavior normal.     ------------------------------------------------------------------------------------------------------------------------------------------------------------------------------------------------------------------- Assessment and Plan  Hypothyroidism He is compliant with levothyroxine.  Update TSH today.   BPH (benign prostatic hyperplasia) No symptoms at this time.  Update PSA.   Hyperlipidemia Update lipid panel.    No orders of the defined types were placed in this encounter.   No follow-ups on file.    This visit occurred during the SARS-CoV-2 public health emergency.  Safety protocols were in place, including screening questions prior to the visit, additional usage of staff PPE, and extensive cleaning of exam room while observing appropriate contact time as indicated for disinfecting solutions.

## 2019-10-19 NOTE — Assessment & Plan Note (Signed)
He is compliant with levothyroxine.  Update TSH today.

## 2019-10-19 NOTE — Assessment & Plan Note (Signed)
No symptoms at this time.  Update PSA.

## 2019-10-19 NOTE — Patient Instructions (Signed)
Great to meet you today! Schedule a visit with me in a couple of weeks for annual physical.   Have labs completed prior to visit so that we can review.

## 2019-10-19 NOTE — Assessment & Plan Note (Signed)
Update lipid panel.  

## 2019-10-24 NOTE — Progress Notes (Signed)
Pt has seen results on MyChart and message also sent for patient to call back if any questions.

## 2019-11-09 ENCOUNTER — Encounter: Payer: 59 | Admitting: Family Medicine

## 2019-11-16 ENCOUNTER — Other Ambulatory Visit: Payer: Self-pay

## 2019-11-16 ENCOUNTER — Ambulatory Visit (INDEPENDENT_AMBULATORY_CARE_PROVIDER_SITE_OTHER): Payer: 59 | Admitting: Family Medicine

## 2019-11-16 ENCOUNTER — Encounter: Payer: Self-pay | Admitting: Family Medicine

## 2019-11-16 DIAGNOSIS — Z0000 Encounter for general adult medical examination without abnormal findings: Secondary | ICD-10-CM | POA: Insufficient documentation

## 2019-11-16 DIAGNOSIS — B351 Tinea unguium: Secondary | ICD-10-CM | POA: Diagnosis not present

## 2019-11-16 MED ORDER — LEVOTHYROXINE SODIUM 100 MCG PO TABS: ORAL_TABLET | ORAL | 2 refills | Status: DC

## 2019-11-16 MED ORDER — TERBINAFINE HCL 250 MG PO TABS: 250.0000 mg | ORAL_TABLET | Freq: Every day | ORAL | 0 refills | Status: DC

## 2019-11-16 MED ORDER — SILDENAFIL CITRATE 100 MG PO TABS: 100.0000 mg | ORAL_TABLET | Freq: Every day | ORAL | 2 refills | Status: DC | PRN

## 2019-11-16 NOTE — Progress Notes (Signed)
Joel Owen - 54 y.o. male MRN [id number]  Date of birth: 12/03/1965  Subjective No chief complaint on file.   HPI Joel Owen is a 54 y.o. male here today for annual exam.  He had labs completed prior to visit.  Cholesterol was a little elevated.  ASCVD risk of 6.8%.  He prefers to hold off on starting medication for this.  He is working on increasing exercise and cutting back on EtOH consumption.    He also has toenail fungus.  Treated with griseofulvin in the past but this returned.    He is a former smoker.  He is working on dietary improvement.   Review of Systems  Constitutional: Negative for chills, fever, malaise/fatigue and weight loss.  HENT: Negative for congestion, ear pain and sore throat.   Eyes: Negative for blurred vision, double vision and pain.  Respiratory: Negative for cough and shortness of breath.   Cardiovascular: Negative for chest pain and palpitations.  Gastrointestinal: Negative for abdominal pain, blood in stool, constipation, heartburn and nausea.  Genitourinary: Negative for dysuria and urgency.  Musculoskeletal: Negative for joint pain and myalgias.  Neurological: Negative for dizziness and headaches.  Endo/Heme/Allergies: Does not bruise/bleed easily.  Psychiatric/Behavioral: Negative for depression. The patient is not nervous/anxious and does not have insomnia.     Allergies  Allergen Reactions  . Codeine Other (See Comments)    itching    Past Medical History:  Diagnosis Date  . Adjustment disorder    with depressed mood  . Allergic state 09/04/2013   per pt/ not sure what reaction  . BPH (benign prostatic hyperplasia)   . 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 Insomnia   . Rectal abscess    history of, s/p surgery  . Thyroid disease    hypothyroidism    Past Surgical History:  Procedure Laterality Date  . CARPAL TUNNEL RELEASE  02/07/2011   Dr  Tyrone Sage BIL  . HAND SURGERY     2 screws in left hand  . TONSILLECTOMY     1971     Social History   Socioeconomic History  . Marital status: Married    Spouse name: Not on file  . Number of children: 0  . Years of education: Not on file  . Highest education level: Not on file  Occupational History  . 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 Smoking status: Former Smoker    Types: Cigarettes    Quit date: 10/25/1989    Years since quitting: 30.0  . Smokeless tobacco: Former Systems developer    Types: Chew  Substance and Sexual Activity  . Alcohol use: Yes    Alcohol/week: 14.0 standard drinks    Types: 14 Shots of liquor per week  . Drug use: No  . Sexual activity: Not on file  Other Topics Concern  . Not on file  Social History Narrative   Pt is divorced.              Social Determinants of Health   Financial Resource Strain:   . Difficulty of Paying Living Expenses:   Food Insecurity:   . Worried About Charity fundraiser in the Last Year:   . Arboriculturist in the Last Year:   Transportation Needs:   . Film/video editor (Medical):   Marland Kitchen Lack of Transportation (Non-Medical):   Physical Activity:   . Days of Exercise per Week:   . Minutes of Exercise per  Session:   Stress:   . Feeling of Stress :   Social Connections:   . Frequency of Communication with Friends and Family:   . Frequency of Social Gatherings with Friends and Family:   . Attends Religious Services:   . Active Member of Clubs or Organizations:   . Attends Archivist Meetings:   Marland Kitchen Marital Status:     Family History  Problem Relation Age of Onset  . Hypertension Mother   . Hyperlipidemia Mother   . Gout Father   . Arthritis Father   . Memory loss Father   . Diverticulitis Sister   . Cholelithiasis Sister   . Heart disease Maternal Grandfather     Health Maintenance  Topic Date Due  . TETANUS/TDAP  10/18/2020 (Originally 10/04/2017)  . Hepatitis C Screening  10/18/2020 (Originally November 08, 1965)  . HIV Screening  10/18/2020 (Originally  10/11/1978)  . INFLUENZA VACCINE  02/19/2020  . COLONOSCOPY  11/09/2023  . COVID-19 Vaccine  Completed     ----------------------------------------------------------------------------------------------------------------------------------------------------------------------------------------------------------------- Physical Exam BP 124/86 (BP Location: Left Arm, Patient Position: Sitting, Cuff Size: Normal)   Pulse 70   Wt 200 lb 1.6 oz (90.8 kg)   SpO2 100%   BMI 27.91 kg/m   Physical Exam Constitutional:      General: He is not in acute distress. HENT:     Head: Normocephalic and atraumatic.     Right Ear: External ear normal.     Left Ear: External ear normal.     Mouth/Throat:     Mouth: Mucous membranes are moist.  Eyes:     General: No scleral icterus. Neck:     Thyroid: No thyromegaly.  Cardiovascular:     Rate and Rhythm: Normal rate and regular rhythm.     Heart sounds: Normal heart sounds.  Pulmonary:     Effort: Pulmonary effort is normal.     Breath sounds: Normal breath sounds.  Abdominal:     General: Bowel sounds are normal. There is no distension.     Palpations: Abdomen is soft.     Tenderness: There is no abdominal tenderness. There is no guarding.  Musculoskeletal:     Cervical back: Normal range of motion.  Lymphadenopathy:     Cervical: No cervical adenopathy.  Skin:    General: Skin is warm and dry.     Findings: No rash.  Neurological:     Mental Status: He is alert and oriented to person, place, and time.     Cranial Nerves: No cranial nerve deficit.     Motor: No abnormal muscle tone.  Psychiatric:        Mood and Affect: Mood normal.        Behavior: Behavior normal.     ------------------------------------------------------------------------------------------------------------------------------------------------------------------------------------------------------------------- Assessment and Plan  Well adult exam Well adult No  orders of the defined types were placed in this encounter. Screening: UTD Immunizations: UTD Anticipatory guidance/Risk factor reduction:  Labs reviewed.  Working on lifestyle change to improve cholesterol. Additional recommendations per AVS  Onychomycosis Discussed trying terbinafine which he would like to proceed with.  250mg  daily x12 weeks sent in.     Meds ordered this encounter  Medications  . terbinafine (LAMISIL) 250 MG tablet    Sig: Take 1 tablet (250 mg total) by mouth daily.    Dispense:  84 tablet    Refill:  0  . levothyroxine (SYNTHROID) 100 MCG tablet    Sig: TAKE 1 TABLET BY MOUTH EACH DAY  Dispense:  90 tablet    Refill:  2  . sildenafil (VIAGRA) 100 MG tablet    Sig: Take 1 tablet (100 mg total) by mouth daily as needed for erectile dysfunction.    Dispense:  30 tablet    Refill:  2    Return in about 6 months (around 05/17/2020) for Cholesterol/Thyroid.    This visit occurred during the SARS-CoV-2 public health emergency.  Safety protocols were in place, including screening questions prior to the visit, additional usage of staff PPE, and extensive cleaning of exam room while observing appropriate contact time as indicated for disinfecting solutions.

## 2019-11-16 NOTE — Assessment & Plan Note (Signed)
Well adult No orders of the defined types were placed in this encounter. Screening: UTD Immunizations: UTD Anticipatory guidance/Risk factor reduction:  Labs reviewed.  Working on lifestyle change to improve cholesterol. Additional recommendations per AVS

## 2019-11-16 NOTE — Assessment & Plan Note (Signed)
Discussed trying terbinafine which he would like to proceed with.  250mg  daily x12 weeks sent in.

## 2019-11-16 NOTE — Patient Instructions (Signed)
Preventive Care 41-56 Years Old, Male Preventive care refers to lifestyle choices and visits with your health care provider that can promote health and wellness. This includes:  A yearly physical exam. This is also called an annual well check.  Regular dental and eye exams.  Immunizations.  Screening for certain conditions.  Healthy lifestyle choices, such as eating a healthy diet, getting regular exercise, not using drugs or products that contain nicotine and tobacco, and limiting alcohol use. What can I expect for my preventive care visit? Physical exam Your health care provider will check:  Height and weight. These may be used to calculate body mass index (BMI), which is a measurement that tells if you are at a healthy weight.  Heart rate and blood pressure.  Your skin for abnormal spots. Counseling Your health care provider may ask you questions about:  Alcohol, tobacco, and drug use.  Emotional well-being.  Home and relationship well-being.  Sexual activity.  Eating habits.  Work and work Statistician. What immunizations do I need?  Influenza (flu) vaccine  This is recommended every year. Tetanus, diphtheria, and pertussis (Tdap) vaccine  You may need a Td booster every 10 years. Varicella (chickenpox) vaccine  You may need this vaccine if you have not already been vaccinated. Zoster (shingles) vaccine  You may need this after age 64. Measles, mumps, and rubella (MMR) vaccine  You may need at least one dose of MMR if you were born in 1957 or later. You may also need a second dose. Pneumococcal conjugate (PCV13) vaccine  You may need this if you have certain conditions and were not previously vaccinated. Pneumococcal polysaccharide (PPSV23) vaccine  You may need one or two doses if you smoke cigarettes or if you have certain conditions. Meningococcal conjugate (MenACWY) vaccine  You may need this if you have certain conditions. Hepatitis A  vaccine  You may need this if you have certain conditions or if you travel or work in places where you may be exposed to hepatitis A. Hepatitis B vaccine  You may need this if you have certain conditions or if you travel or work in places where you may be exposed to hepatitis B. Haemophilus influenzae type b (Hib) vaccine  You may need this if you have certain risk factors. Human papillomavirus (HPV) vaccine  If recommended by your health care provider, you may need three doses over 6 months. You may receive vaccines as individual doses or as more than one vaccine together in one shot (combination vaccines). Talk with your health care provider about the risks and benefits of combination vaccines. What tests do I need? Blood tests  Lipid and cholesterol levels. These may be checked every 5 years, or more frequently if you are over 60 years old.  Hepatitis C test.  Hepatitis B test. Screening  Lung cancer screening. You may have this screening every year starting at age 43 if you have a 30-pack-year history of smoking and currently smoke or have quit within the past 15 years.  Prostate cancer screening. Recommendations will vary depending on your family history and other risks.  Colorectal cancer screening. All adults should have this screening starting at age 72 and continuing until age 2. Your health care provider may recommend screening at age 14 if you are at increased risk. You will have tests every 1-10 years, depending on your results and the type of screening test.  Diabetes screening. This is done by checking your blood sugar (glucose) after you have not eaten  for a while (fasting). You may have this done every 1-3 years.  Sexually transmitted disease (STD) testing. Follow these instructions at home: Eating and drinking  Eat a diet that includes fresh fruits and vegetables, whole grains, lean protein, and low-fat dairy products.  Take vitamin and mineral supplements as  recommended by your health care provider.  Do not drink alcohol if your health care provider tells you not to drink.  If you drink alcohol: ? Limit how much you have to 0-2 drinks a day. ? Be aware of how much alcohol is in your drink. In the [place] one drink equals one 12 oz bottle of beer (355 mL), one 5 oz glass of wine (148 mL), or one 1 oz glass of hard liquor (44 mL). Lifestyle  Take daily care of your teeth and gums.  Stay active. Exercise for at least 30 minutes on 5 or more days each week.  Do not use any products that contain nicotine or tobacco, such as cigarettes, e-cigarettes, and chewing tobacco. If you need help quitting, ask your health care provider.  If you are sexually active, practice safe sex. Use a condom or other form of protection to prevent STIs (sexually transmitted infections).  Talk with your health care provider about taking a low-dose aspirin every day starting at age 53. What's next?  Go to your health care provider once a year for a well check visit.  Ask your health care provider how often you should have your eyes and teeth checked.  Stay up to date on all vaccines. This information is not intended to replace advice given to you by your health care provider. Make sure you discuss any questions you have with your health care provider. Document Revised: 07/01/2018 Document Reviewed: 07/01/2018 Elsevier Patient Education  2020 [employer].

## 2020-05-17 ENCOUNTER — Other Ambulatory Visit: Payer: Self-pay

## 2020-05-17 ENCOUNTER — Ambulatory Visit (INDEPENDENT_AMBULATORY_CARE_PROVIDER_SITE_OTHER): Payer: 59 | Admitting: Family Medicine

## 2020-05-17 ENCOUNTER — Encounter: Payer: Self-pay | Admitting: Family Medicine

## 2020-05-17 VITALS — BP 127/73 | HR 63 | Wt 209.6 lb

## 2020-05-17 DIAGNOSIS — E785 Hyperlipidemia, unspecified: Secondary | ICD-10-CM | POA: Diagnosis not present

## 2020-05-17 DIAGNOSIS — E039 Hypothyroidism, unspecified: Secondary | ICD-10-CM

## 2020-05-17 NOTE — Progress Notes (Signed)
Joel Owen - 54 y.o. male MRN [id number]  Date of birth: 03-13-1966  Subjective Chief Complaint  Patient presents with  . Hyperlipidemia    HPI Joel Owen is a 54 y.o. male here today for follow up of hypothyroidism and HLD.  He reports that overall he feels well.   He has never really had any symptoms related to his hypothyroidism.  He has been on a pretty stable dose of levothyroxine.     He is not currently on any medications for treatment of HLD.   He has made some changes to diet and lifestyle to help improve this.  ROS:  A comprehensive ROS was completed and negative except as noted per HPI  Allergies  Allergen Reactions  . Codeine Other (See Comments)    itching    Past Medical History:  Diagnosis Date  . Adjustment disorder    with depressed mood  . Allergic state 09/04/2013   per pt/ not sure what reaction  . BPH (benign prostatic hyperplasia)   . 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 Insomnia   . Rectal abscess    history of, s/p surgery  . Thyroid disease    hypothyroidism    Past Surgical History:  Procedure Laterality Date  . CARPAL TUNNEL RELEASE  02/07/2011   Dr  Tyrone Sage BIL  . HAND SURGERY     2 screws in left hand  . TONSILLECTOMY     1971    Social History   Socioeconomic History  . Marital status: Married    Spouse name: Not on file  . Number of children: 0  . Years of education: Not on file  . Highest education level: Not on file  Occupational History  . 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 Smoking status: Former Smoker    Types: Cigarettes    Quit date: 10/25/1989    Years since quitting: 30.5  . Smokeless tobacco: Former Systems developer    Types: Chew  Substance and Sexual Activity  . Alcohol use: Yes    Alcohol/week: 14.0 standard drinks    Types: 14 Shots of liquor per week  . Drug use: No  . Sexual activity: Not on file  Other Topics Concern  . Not on file  Social History Narrative   Pt is divorced.               Social Determinants of Health   Financial Resource Strain:   . Difficulty of Paying Living Expenses: Not on file  Food Insecurity:   . Worried About Charity fundraiser in the Last Year: Not on file  . Ran Out of Food in the Last Year: Not on file  Transportation Needs:   . Lack of Transportation (Medical): Not on file  . Lack of Transportation (Non-Medical): Not on file  Physical Activity:   . Days of Exercise per Week: Not on file  . Minutes of Exercise per Session: Not on file  Stress:   . Feeling of Stress : Not on file  Social Connections:   . Frequency of Communication with Friends and Family: Not on file  . Frequency of Social Gatherings with Friends and Family: Not on file  . Attends Religious Services: Not on file  . Active Member of Clubs or Organizations: Not on file  . Attends Archivist Meetings: Not on file  . Marital Status: Not on file    Family History  Problem Relation Age of  Onset  . Hypertension Mother   . Hyperlipidemia Mother   . Gout Father   . Arthritis Father   . Memory loss Father   . Diverticulitis Sister   . Cholelithiasis Sister   . Heart disease Maternal Grandfather     Health Maintenance  Topic Date Due  . TETANUS/TDAP  10/18/2020 (Originally 10/04/2017)  . Hepatitis C Screening  10/18/2020 (Originally 07-19-1966)  . HIV Screening  10/18/2020 (Originally 10/11/1978)  . COLONOSCOPY  11/09/2023  . INFLUENZA VACCINE  Completed  . COVID-19 Vaccine  Completed     ----------------------------------------------------------------------------------------------------------------------------------------------------------------------------------------------------------------- Physical Exam BP 127/73 (BP Location: Left Arm, Patient Position: Sitting, Cuff Size: Normal)   Pulse 63   Wt 209 lb 9.6 oz (95.1 kg)   SpO2 100%   BMI 29.23 kg/m   Physical Exam Constitutional:      Appearance: Normal appearance.  HENT:     Head:  Normocephalic and atraumatic.  Eyes:     General: No scleral icterus. Cardiovascular:     Rate and Rhythm: Normal rate and regular rhythm.  Pulmonary:     Effort: Pulmonary effort is normal.     Breath sounds: Normal breath sounds.  Musculoskeletal:     Cervical back: Neck supple.  Neurological:     General: No focal deficit present.     Mental Status: He is alert.  Psychiatric:        Mood and Affect: Mood normal.        Behavior: Behavior normal.     ------------------------------------------------------------------------------------------------------------------------------------------------------------------------------------------------------------------- Assessment and Plan  Hypothyroidism No symptoms.  Update TSH  Hyperlipidemia Update lipid panel.    No orders of the defined types were placed in this encounter.   Return in about 6 months (around 11/15/2020) for Annual exam.    This visit occurred during the SARS-CoV-2 public health emergency.  Safety protocols were in place, including screening questions prior to the visit, additional usage of staff PPE, and extensive cleaning of exam room while observing appropriate contact time as indicated for disinfecting solutions.

## 2020-05-17 NOTE — Assessment & Plan Note (Signed)
Update lipid panel.  

## 2020-05-17 NOTE — Assessment & Plan Note (Signed)
No symptoms.  Update TSH

## 2020-05-18 LAB — LIPID PANEL
Cholesterol: 201 mg/dL — ABNORMAL HIGH (ref <200)
HDL: 37 mg/dL — ABNORMAL LOW (ref >=40)
LDL Cholesterol (Calc): 129 mg/dL (calc) — ABNORMAL HIGH
Non-HDL Cholesterol (Calc): 164 mg/dL (calc) — ABNORMAL HIGH (ref <130)
Total CHOL/HDL Ratio: 5.4 (calc) — ABNORMAL HIGH (ref <5.0)
Triglycerides: 212 mg/dL — ABNORMAL HIGH (ref <150)

## 2020-05-18 LAB — TSH: TSH: 1.86 mIU/L (ref 0.40–4.50)

## 2020-08-03 ENCOUNTER — Other Ambulatory Visit: Payer: Self-pay | Admitting: Family Medicine

## 2020-11-15 ENCOUNTER — Encounter: Payer: Self-pay | Admitting: Family Medicine

## 2020-11-15 ENCOUNTER — Other Ambulatory Visit: Payer: Self-pay

## 2020-11-15 ENCOUNTER — Ambulatory Visit (INDEPENDENT_AMBULATORY_CARE_PROVIDER_SITE_OTHER): Payer: 59 | Admitting: Family Medicine

## 2020-11-15 VITALS — BP 126/82 | HR 65 | Temp 97.5°F | Ht 72.0 in | Wt 207.9 lb

## 2020-11-15 DIAGNOSIS — N486 Induration penis plastica: Secondary | ICD-10-CM

## 2020-11-15 DIAGNOSIS — E039 Hypothyroidism, unspecified: Secondary | ICD-10-CM | POA: Diagnosis not present

## 2020-11-15 DIAGNOSIS — N400 Enlarged prostate without lower urinary tract symptoms: Secondary | ICD-10-CM

## 2020-11-15 DIAGNOSIS — Z0000 Encounter for general adult medical examination without abnormal findings: Secondary | ICD-10-CM

## 2020-11-15 DIAGNOSIS — E782 Mixed hyperlipidemia: Secondary | ICD-10-CM | POA: Diagnosis not present

## 2020-11-15 NOTE — Progress Notes (Signed)
Joel Owen - 55 y.o. male MRN [id number]  Date of birth: 07-26-1965  Subjective Chief Complaint  Patient presents with  . Annual Exam    HPI Joel Owen is a 55 y.o. male here today for annual exam.  He is doing well with current chronic medications.    Only other issue is that he has noticed a curvature to his penis.  This is somewhat bothersome to him.  Denies injury or pain with intercourse.  He is a former smoker and has about 2 shots of alcohol each night.    He stays pretty active and feels like his diet is pretty good.  He has been making changes including eating steel cut oats in the morning to help with cholesterol/TG levels.   Review of Systems  Constitutional: Negative for chills, fever, malaise/fatigue and weight loss.  HENT: Negative for congestion, ear pain and sore throat.   Eyes: Negative for blurred vision, double vision and pain.  Respiratory: Negative for cough and shortness of breath.   Cardiovascular: Negative for chest pain and palpitations.  Gastrointestinal: Negative for abdominal pain, blood in stool, constipation, heartburn and nausea.  Genitourinary: Negative for dysuria and urgency.  Musculoskeletal: Negative for joint pain and myalgias.  Neurological: Negative for dizziness and headaches.  Endo/Heme/Allergies: Does not bruise/bleed easily.  Psychiatric/Behavioral: Negative for depression. The patient is not nervous/anxious and does not have insomnia.     Allergies  Allergen Reactions  . Codeine Other (See Comments)    itching    Past Medical History:  Diagnosis Date  . Adjustment disorder    with depressed mood  . Allergic state 09/04/2013   per pt/ not sure what reaction  . BPH (benign prostatic hyperplasia)   . 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 Insomnia   . Rectal abscess    history of, s/p surgery  . Thyroid disease    hypothyroidism    Past Surgical History:  Procedure Laterality Date  . CARPAL TUNNEL RELEASE  02/07/2011   Dr  Tyrone Sage BIL  . HAND SURGERY     2 screws in left hand  . TONSILLECTOMY     1971    Social History   Socioeconomic History  . Marital status: Married    Spouse name: Not on file  . Number of children: 0  . Years of education: Not on file  . Highest education level: Not on file  Occupational History  . Occupation: Music therapist: [employer]  Tobacco Use  . Smoking status: Former Smoker    Types: Cigarettes    Quit date: 10/25/1989    Years since quitting: 31.0  . Smokeless tobacco: Former Systems developer    Types: Chew  Substance and Sexual Activity  . Alcohol use: Yes    Alcohol/week: 14.0 standard drinks    Types: 14 Shots of liquor per week  . Drug use: No  . Sexual activity: Not on file  Other Topics Concern  . Not on file  Social History Narrative   Pt is divorced.              Social Determinants of Health   Financial Resource Strain: Not on file  Food Insecurity: Not on file  Transportation Needs: Not on file  Physical Activity: Not on file  Stress: Not on file  Social Connections: Not on file    Family History  Problem Relation Age of Onset  . Hypertension Mother   . Hyperlipidemia Mother   .  Gout Father   . Arthritis Father   . Memory loss Father   . Diverticulitis Sister   . Cholelithiasis Sister   . Heart disease Maternal Grandfather     Health Maintenance  Topic Date Due  . Hepatitis C Screening  Never done  . HIV Screening  11/15/2021 (Originally 10/11/1978)  . INFLUENZA VACCINE  02/18/2021  . COLONOSCOPY (Pts 45-27yrs Insurance coverage will need to be confirmed)  11/09/2023  . TETANUS/TDAP  07/21/2026  . COVID-19 Vaccine  Completed  . HPV VACCINES  Aged Out     ----------------------------------------------------------------------------------------------------------------------------------------------------------------------------------------------------------------- Physical Exam BP 126/82 (BP Location: Left Arm, Patient Position:  Sitting, Cuff Size: Large)   Pulse 65   Temp (!) 97.5 F (36.4 C)   Ht 6' (1.829 m)   Wt 207 lb 14.4 oz (94.3 kg)   SpO2 99%   BMI 28.20 kg/m   Physical Exam Constitutional:      General: He is not in acute distress. HENT:     Head: Normocephalic and atraumatic.     Right Ear: Tympanic membrane and external ear normal.     Left Ear: Tympanic membrane and external ear normal.  Eyes:     General: No scleral icterus. Neck:     Thyroid: No thyromegaly.  Cardiovascular:     Rate and Rhythm: Normal rate and regular rhythm.     Heart sounds: Normal heart sounds.  Pulmonary:     Effort: Pulmonary effort is normal.     Breath sounds: Normal breath sounds.  Abdominal:     General: Bowel sounds are normal. There is no distension.     Palpations: Abdomen is soft.     Tenderness: There is no abdominal tenderness. There is no guarding.  Musculoskeletal:     Cervical back: Normal range of motion.  Lymphadenopathy:     Cervical: No cervical adenopathy.  Skin:    General: Skin is warm and dry.     Findings: No rash.  Neurological:     Mental Status: He is alert and oriented to person, place, and time.     Cranial Nerves: No cranial nerve deficit.     Motor: No abnormal muscle tone.  Psychiatric:        Behavior: Behavior normal.     ------------------------------------------------------------------------------------------------------------------------------------------------------------------------------------------------------------------- Assessment and Plan  Well adult exam Well adult Orders Placed This Encounter  Procedures  . COMPLETE METABOLIC PANEL WITH GFR  . CBC with Differential  . Lipid Panel w/reflex Direct LDL  . TSH  . PSA  . Ambulatory referral to Urology    Referral Priority:   Routine    Referral Type:   Consultation    Referral Reason:   Specialty Services Required    Requested Specialty:   Urology    Number of Visits Requested:   1  Screening:  PSA Immunization: He plans to get COVID booster #2 Anticipatory guidance/Risk factor reduction:  Recommendations per AVS.    Peyronie disease Referral to urology.     No orders of the defined types were placed in this encounter.   No follow-ups on file.    This visit occurred during the SARS-CoV-2 public health emergency.  Safety protocols were in place, including screening questions prior to the visit, additional usage of staff PPE, and extensive cleaning of exam room while observing appropriate contact time as indicated for disinfecting solutions.

## 2020-11-15 NOTE — Assessment & Plan Note (Signed)
Well adult Orders Placed This Encounter  Procedures  . COMPLETE METABOLIC PANEL WITH GFR  . CBC with Differential  . Lipid Panel w/reflex Direct LDL  . TSH  . PSA  . Ambulatory referral to Urology    Referral Priority:   Routine    Referral Type:   Consultation    Referral Reason:   Specialty Services Required    Requested Specialty:   Urology    Number of Visits Requested:   1  Screening: PSA Immunization: He plans to get COVID booster #2 Anticipatory guidance/Risk factor reduction:  Recommendations per AVS.

## 2020-11-15 NOTE — Patient Instructions (Signed)

## 2020-11-15 NOTE — Assessment & Plan Note (Signed)
Referral to urology

## 2020-11-16 LAB — COMPLETE METABOLIC PANEL WITH GFR
AG Ratio: 2 (calc) (ref 1.0–2.5)
ALT: 16 U/L (ref 9–46)
AST: 17 U/L (ref 10–35)
Albumin: 4.5 g/dL (ref 3.6–5.1)
Alkaline phosphatase (APISO): 54 U/L (ref 35–144)
BUN: 12 mg/dL (ref 7–25)
CO2: 27 mmol/L (ref 20–32)
Calcium: 9.6 mg/dL (ref 8.6–10.3)
Chloride: 106 mmol/L (ref 98–110)
Creat: 0.99 mg/dL (ref 0.70–1.33)
GFR, Est African American: 98 mL/min/{1.73_m2} (ref >=60)
GFR, Est Non African American: 84 mL/min/{1.73_m2} (ref >=60)
Globulin: 2.2 g/dL (calc) (ref 1.9–3.7)
Glucose, Bld: 101 mg/dL — ABNORMAL HIGH (ref 65–99)
Potassium: 4.3 mmol/L (ref 3.5–5.3)
Sodium: 139 mmol/L (ref 135–146)
Total Bilirubin: 0.6 mg/dL (ref 0.2–1.2)
Total Protein: 6.7 g/dL (ref 6.1–8.1)

## 2020-11-16 LAB — CBC WITH DIFFERENTIAL/PLATELET
Absolute Monocytes: 547 cells/uL (ref 200–950)
Basophils Absolute: 60 cells/uL (ref 0–200)
Basophils Relative: 1.3 %
Eosinophils Absolute: 198 cells/uL (ref 15–500)
Eosinophils Relative: 4.3 %
HCT: 42.6 % (ref 38.5–50.0)
Hemoglobin: 14.4 g/dL (ref 13.2–17.1)
Lymphs Abs: 1720 cells/uL (ref 850–3900)
MCH: 32.2 pg (ref 27.0–33.0)
MCHC: 33.8 g/dL (ref 32.0–36.0)
MCV: 95.3 fL (ref 80.0–100.0)
MPV: 10.1 fL (ref 7.5–12.5)
Monocytes Relative: 11.9 %
Neutro Abs: 2075 cells/uL (ref 1500–7800)
Neutrophils Relative %: 45.1 %
Platelets: 242 10*3/uL (ref 140–400)
RBC: 4.47 10*6/uL (ref 4.20–5.80)
RDW: 12 % (ref 11.0–15.0)
Total Lymphocyte: 37.4 %
WBC: 4.6 10*3/uL (ref 3.8–10.8)

## 2020-11-16 LAB — PSA: PSA: 0.26 ng/mL (ref <=4.00)

## 2020-11-16 LAB — LIPID PANEL W/REFLEX DIRECT LDL
Cholesterol: 222 mg/dL — ABNORMAL HIGH (ref <200)
HDL: 39 mg/dL — ABNORMAL LOW (ref >=40)
LDL Cholesterol (Calc): 150 mg/dL (calc) — ABNORMAL HIGH
Non-HDL Cholesterol (Calc): 183 mg/dL (calc) — ABNORMAL HIGH (ref <130)
Total CHOL/HDL Ratio: 5.7 (calc) — ABNORMAL HIGH (ref <5.0)
Triglycerides: 194 mg/dL — ABNORMAL HIGH (ref <150)

## 2020-11-16 LAB — TSH: TSH: 2.51 mIU/L (ref 0.40–4.50)

## 2021-05-11 ENCOUNTER — Other Ambulatory Visit: Payer: Self-pay | Admitting: Family Medicine

## 2021-05-14 ENCOUNTER — Other Ambulatory Visit: Payer: Self-pay | Admitting: Family Medicine

## 2021-06-05 ENCOUNTER — Other Ambulatory Visit: Payer: Self-pay

## 2021-06-05 ENCOUNTER — Ambulatory Visit (INDEPENDENT_AMBULATORY_CARE_PROVIDER_SITE_OTHER): Payer: 59 | Admitting: Family Medicine

## 2021-06-05 ENCOUNTER — Ambulatory Visit (INDEPENDENT_AMBULATORY_CARE_PROVIDER_SITE_OTHER): Payer: 59

## 2021-06-05 ENCOUNTER — Encounter: Payer: Self-pay | Admitting: Family Medicine

## 2021-06-05 VITALS — BP 129/88 | HR 64 | Temp 97.7°F | Ht 72.0 in | Wt 191.0 lb

## 2021-06-05 DIAGNOSIS — M25522 Pain in left elbow: Secondary | ICD-10-CM | POA: Insufficient documentation

## 2021-06-05 MED ORDER — PREDNISONE 50 MG PO TABS: ORAL_TABLET | ORAL | 0 refills | Status: DC

## 2021-06-05 NOTE — Progress Notes (Signed)
Joel Owen - 55 y.o. male MRN [id number]  Date of birth: 1965-08-09  Subjective Chief Complaint  Patient presents with   Elbow Pain    HPI Al is a 55 year old male here today with complaint of left elbow pain.  The leg pain is located in the elbow joint.  He has had bursitis of the elbow in the past however this feels different and he does not have any swelling of the elbow.  He denies any injury or overuse.  He has not tried anything other than naproxen which has provided some relief.  ROS:  A comprehensive ROS was completed and negative except as noted per HPI  Allergies  Allergen Reactions   Codeine Other (See Comments)    itching    Past Medical History:  Diagnosis Date   Adjustment disorder    with depressed mood   Allergic state 09/04/2013   per pt/ not sure what reaction   BPH (benign prostatic hyperplasia)    Epicondylitis 09/07/2012   Insomnia    Rectal abscess    history of, s/p surgery   Thyroid disease    hypothyroidism    Past Surgical History:  Procedure Laterality Date   CARPAL TUNNEL RELEASE  02/07/2011   Dr  Tyrone Sage BIL   HAND SURGERY     2 screws in left hand   TONSILLECTOMY     1971    Social History   Socioeconomic History   Marital status: Married    Spouse name: Not on file   Number of children: 0   Years of education: Not on file   Highest education level: Not on file  Occupational History   Occupation: Dealer    Employer: [employer]  Tobacco Use   Smoking status: Former    Types: Cigarettes    Quit date: 10/25/1989    Years since quitting: 31.6   Smokeless tobacco: Former    Types: Chew  Substance and Sexual Activity   Alcohol use: Yes    Alcohol/week: 14.0 standard drinks    Types: 14 Shots of liquor per week   Drug use: No   Sexual activity: Not on file  Other Topics Concern   Not on file  Social History Narrative   Pt is divorced.              Social Determinants of Health   Financial Resource Strain:  Not on file  Food Insecurity: Not on file  Transportation Needs: Not on file  Physical Activity: Not on file  Stress: Not on file  Social Connections: Not on file    Family History  Problem Relation Age of Onset   Hypertension Mother    Hyperlipidemia Mother    Gout Father    Arthritis Father    Memory loss Father    Diverticulitis Sister    Cholelithiasis Sister    Heart disease Maternal Grandfather     Health Maintenance  Topic Date Due   Hepatitis C Screening  Never done   Zoster Vaccines- Shingrix (1 of 2) Never done   COVID-19 Vaccine (2 - Booster for [employer] series) 02/08/2021   HIV Screening  11/15/2021 (Originally 10/11/1978)   COLONOSCOPY (Pts 45-63yrs Insurance coverage will need to be confirmed)  11/09/2023   TETANUS/TDAP  07/22/2027   INFLUENZA VACCINE  Completed   Pneumococcal Vaccine 63-59 Years old  Aged Out   HPV VACCINES  Aged Out     ----------------------------------------------------------------------------------------------------------------------------------------------------------------------------------------------------------------- Physical Exam BP 129/88 (BP Location: Left Arm,  Patient Position: Sitting, Cuff Size: Large)   Pulse 64   Temp 97.7 F (36.5 C)   Ht 6' (1.829 m)   Wt 191 lb (86.6 kg)   SpO2 100%   BMI 25.90 kg/m   Physical Exam Constitutional:      Appearance: Normal appearance.  Musculoskeletal:     Comments: Left elbow normal to inspection and palpation.  No swelling or effusion noted. Range of motion is normal.  He does have increased pain with flexion and extension.   Neurological:     General: No focal deficit present.     Mental Status: He is alert.  Psychiatric:        Mood and Affect: Mood normal.        Behavior: Behavior normal.     ------------------------------------------------------------------------------------------------------------------------------------------------------------------------------------------------------------------- Assessment and Plan  Left elbow pain X-rays of left elbow ordered.  I reviewed these and I do not see any significant abnormalities on these.  Starting trial of prednisone to see if this improves his symptoms.   Meds ordered this encounter  Medications   predniSONE (DELTASONE) 50 MG tablet    Sig: Take 1 tab po daily x5 days.    Dispense:  5 tablet    Refill:  0    No follow-ups on file.    This visit occurred during the SARS-CoV-2 public health emergency.  Safety protocols were in place, including screening questions prior to the visit, additional usage of staff PPE, and extensive cleaning of exam room while observing appropriate contact time as indicated for disinfecting solutions.

## 2021-06-05 NOTE — Assessment & Plan Note (Addendum)
X-rays of left elbow ordered.  I reviewed these and I do not see any significant abnormalities on these.  Starting trial of prednisone to see if this improves his symptoms.

## 2021-06-07 ENCOUNTER — Other Ambulatory Visit: Payer: Self-pay | Admitting: Family Medicine

## 2021-06-07 DIAGNOSIS — M25522 Pain in left elbow: Secondary | ICD-10-CM

## 2021-06-20 ENCOUNTER — Other Ambulatory Visit: Payer: Self-pay

## 2021-06-20 ENCOUNTER — Ambulatory Visit (INDEPENDENT_AMBULATORY_CARE_PROVIDER_SITE_OTHER): Payer: 59 | Admitting: Orthopedic Surgery

## 2021-06-20 DIAGNOSIS — M25522 Pain in left elbow: Secondary | ICD-10-CM

## 2021-06-20 NOTE — Progress Notes (Signed)
Office Visit Note   Patient: Joel Owen           Date of Birth: April 26, 1966           MRN: [id number] Visit Date: 06/20/2021              Requested by: Luetta Nutting, [place] [address] [place],  [place] [address] PCP: Luetta Nutting, DO   Assessment & Plan: Visit Diagnoses:  1. Pain in left elbow     Plan: Patient with no pain today and minimal exam findings today.  Will prescribe and anti-inflammatory today.  Patient will make note of what specific activities or motions bother him.  I can see him back in a month if he's still having difficulty and can consider an MRI at that point.   Follow-Up Instructions: No follow-ups on file.   Orders:  No orders of the defined types were placed in this encounter.  No orders of the defined types were placed in this encounter.     Procedures: No procedures performed   Clinical Data: No additional findings.   Subjective: Chief Complaint  Patient presents with   Left Elbow - Pain    This is a 55 year old right-hand-dominant male who presents with 5 to 6 months of left elbow pain.  He denies any injury to the elbow.  The pain is intermittent.  It is centered around the elbow joint itself.  He has pain mostly with terminal flexion extension.  The pain comes on suddenly and then resolved spontaneously on its own within several minutes.  Overall he thinks he is improved since his symptoms began.  He is on a prednisone taper for 5 days which she is unsure was helpful.  He denies any numbness or paresthesias in the hand.  He takes Aleve occasionally.  He denies any clunking or mechanical symptoms.  He denies any subjective instability of the elbow.   Review of Systems   Objective: Vital Signs: There were no vitals taken for this visit.  Physical Exam Constitutional:      Appearance: Normal appearance.  Cardiovascular:     Rate and Rhythm: Normal rate.     Pulses: Normal pulses.  Pulmonary:     Effort:  Pulmonary effort is normal.  Skin:    General: Skin is warm and dry.     Capillary Refill: Capillary refill takes less than 2 seconds.  Neurological:     Mental Status: He is alert.    Right Elbow Exam   Tenderness  The patient is experiencing no tenderness.   Muscle Strength  The patient has normal right elbow strength.  Tests  Varus: negative Valgus: negative  Other  Erythema: absent Sensation: normal Pulse: present  Comments:  No point tenderness.  Unable to really reproduce his pain.  No pain w/ resisted wrist or middle finger extension with elbow extended.  No pain w/ resisted elbow flexion.  No pain w/ palpation of biceps tendon.  No pain w/ prosupination.  Near full ROM but lacks a few degrees of terminal extension.      Specialty Comments:  No specialty comments available.  Imaging: Multiple views of the left elbow from 11/17 are reviewed and interpreted by me.  They demonstrate a concentric radiocapitellar and ulnohumeral joint.  There is a small calcific fragment that appears to be outside of the joint.  There are no acute fractures.  There are minimal degenerative changes.    PMFS History: Patient  Active Problem List   Diagnosis Date Noted   Pain in left elbow 06/05/2021   Peyronie disease 11/15/2020   Well adult exam 11/16/2019   Onychomycosis 11/16/2019   Allergic state 09/04/2013   Routine general medical examination at a health care facility 09/05/2011   Erectile dysfunction 02/23/2011   Hyperlipidemia 08/21/2010   HEMORRHOIDS, EXTERNAL 02/22/2010   Hypothyroidism 03/13/2008   BPH (benign prostatic hyperplasia) 03/13/2008   Past Medical History:  Diagnosis Date   Adjustment disorder    with depressed mood   Allergic state 09/04/2013   per pt/ not sure what reaction   BPH (benign prostatic hyperplasia)    Epicondylitis 09/07/2012   Insomnia    Rectal abscess    history of, s/p surgery   Thyroid disease    hypothyroidism    Family History   Problem Relation Age of Onset   Hypertension Mother    Hyperlipidemia Mother    Gout Father    Arthritis Father    Memory loss Father    Diverticulitis Sister    Cholelithiasis Sister    Heart disease Maternal Grandfather     Past Surgical History:  Procedure Laterality Date   CARPAL TUNNEL RELEASE  02/07/2011   Dr  Tyrone Sage BIL   HAND SURGERY     2 screws in left hand   TONSILLECTOMY     1971   Social History   Occupational History   Occupation: Music therapist: [employer]  Tobacco Use   Smoking status: Former    Types: Cigarettes    Quit date: 10/25/1989    Years since quitting: 31.6   Smokeless tobacco: Former    Types: Chew  Substance and Sexual Activity   Alcohol use: Yes    Alcohol/week: 14.0 standard drinks    Types: 14 Shots of liquor per week   Drug use: No   Sexual activity: Not on file

## 2021-06-21 ENCOUNTER — Telehealth: Payer: Self-pay | Admitting: Orthopedic Surgery

## 2021-06-21 ENCOUNTER — Other Ambulatory Visit: Payer: Self-pay | Admitting: Orthopedic Surgery

## 2021-06-21 MED ORDER — DICLOFENAC SODIUM 75 MG PO TBEC: 75.0000 mg | DELAYED_RELEASE_TABLET | Freq: Two times a day (BID) | ORAL | 0 refills | Status: AC

## 2021-06-21 NOTE — Telephone Encounter (Signed)
Pt wondering if he can get his antiinflammatory called in.   CB [phone]

## 2021-06-25 ENCOUNTER — Ambulatory Visit: Payer: 59 | Admitting: Family Medicine

## 2021-07-25 ENCOUNTER — Ambulatory Visit: Payer: 59 | Admitting: Orthopedic Surgery

## 2021-10-24 ENCOUNTER — Ambulatory Visit: Payer: 59 | Admitting: Physician Assistant

## 2021-11-07 ENCOUNTER — Other Ambulatory Visit: Payer: Self-pay | Admitting: Family Medicine

## 2021-11-18 ENCOUNTER — Encounter: Payer: 59 | Admitting: Family Medicine

## 2021-11-25 ENCOUNTER — Encounter: Payer: Self-pay | Admitting: Family Medicine

## 2021-11-25 ENCOUNTER — Ambulatory Visit (INDEPENDENT_AMBULATORY_CARE_PROVIDER_SITE_OTHER): Payer: 59 | Admitting: Family Medicine

## 2021-11-25 VITALS — BP 107/69 | HR 68 | Temp 98.0°F | Ht 72.0 in | Wt 190.0 lb

## 2021-11-25 DIAGNOSIS — N400 Enlarged prostate without lower urinary tract symptoms: Secondary | ICD-10-CM | POA: Diagnosis not present

## 2021-11-25 DIAGNOSIS — E785 Hyperlipidemia, unspecified: Secondary | ICD-10-CM

## 2021-11-25 DIAGNOSIS — E039 Hypothyroidism, unspecified: Secondary | ICD-10-CM

## 2021-11-25 DIAGNOSIS — Z0000 Encounter for general adult medical examination without abnormal findings: Secondary | ICD-10-CM | POA: Diagnosis not present

## 2021-11-25 DIAGNOSIS — Z23 Encounter for immunization: Secondary | ICD-10-CM

## 2021-11-25 MED ORDER — AZELASTINE HCL 0.1 % NA SOLN: 2.0000 | Freq: Two times a day (BID) | NASAL | 12 refills | Status: DC

## 2021-11-25 NOTE — Progress Notes (Signed)
?Joel Owen - 56 y.o. male MRN [id number]  Date of birth: 1966/04/19 ? ?Subjective ?No chief complaint on file. ? ? ?HPI ?Joel Owen is a 56 y.o. male here today for annual exam.  Reports that he is doing ok today.  His father passed away a few months ago.  He is doing ok adjusting to this.  ? ?Having some sores in his nose from flonase use.  Would like alternative.  ? ?He continues on levothyroxine for management of hypothyroidism.  Feels good at current dose.  ? ?He does try to stay pretty active.  He feels like diet is pretty good.   ? ?He does not use tobacco products.  He consumes EtOH a few days during the week.   ? ?He would like to have shingrix vaccine today.  ? ?Review of Systems  ?Constitutional:  Negative for chills, fever, malaise/fatigue and weight loss.  ?HENT:  Negative for congestion, ear pain and sore throat.   ?Eyes:  Negative for blurred vision, double vision and pain.  ?Respiratory:  Negative for cough and shortness of breath.   ?Cardiovascular:  Negative for chest pain and palpitations.  ?Gastrointestinal:  Negative for abdominal pain, blood in stool, constipation, heartburn and nausea.  ?Genitourinary:  Negative for dysuria and urgency.  ?Musculoskeletal:  Negative for joint pain and myalgias.  ?Neurological:  Negative for dizziness and headaches.  ?Endo/Heme/Allergies:  Does not bruise/bleed easily.  ?Psychiatric/Behavioral:  Negative for depression. The patient is not nervous/anxious and does not have insomnia.   ? ?Allergies  ?Allergen Reactions  ? Codeine Other (See Comments)  ?  itching  ? ? ?Past Medical History:  ?Diagnosis Date  ? Adjustment disorder   ? with depressed mood  ? Allergic state 09/04/2013  ? per pt/ not sure what reaction  ? BPH (benign prostatic hyperplasia)   ? 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 Insomnia   ? Rectal abscess   ? history of, s/p surgery  ? Thyroid disease   ? hypothyroidism  ? ? ?Past Surgical History:  ?Procedure Laterality Date  ? CARPAL TUNNEL RELEASE   02/07/2011  ? Dr  Tyrone Sage BIL  ? HAND SURGERY    ? 2 screws in left hand  ? TONSILLECTOMY    ? 1971  ? ? ?Social History  ? ?Socioeconomic History  ? Marital status: Married  ?  Spouse name: Not on file  ? Number of children: 0  ? Years of education: Not on file  ? Highest education level: Not on file  ?Occupational History  ? Occupation: Dealer  ?  Employer: [employer]  ?Tobacco Use  ? Smoking status: Former  ?  Types: Cigarettes  ?  Quit date: 10/25/1989  ?  Years since quitting: 32.1  ? Smokeless tobacco: Former  ?  Types: Chew  ?Substance and Sexual Activity  ? Alcohol use: Yes  ?  Alcohol/week: 14.0 standard drinks  ?  Types: 14 Shots of liquor per week  ? Drug use: No  ? Sexual activity: Not on file  ?Other Topics Concern  ? Not on file  ?Social History Narrative  ? Pt is divorced.    ?   ?   ?   ? ?Social Determinants of Health  ? ?Financial Resource Strain: Not on file  ?Food Insecurity: Not on file  ?Transportation Needs: Not on file  ?Physical Activity: Not on file  ?Stress: Not on file  ?Social Connections: Not on file  ? ? ?Family History  ?  Problem Relation Age of Onset  ? Hypertension Mother   ? Hyperlipidemia Mother   ? Gout Father   ? Arthritis Father   ? Memory loss Father   ? Diverticulitis Sister   ? Cholelithiasis Sister   ? Heart disease Maternal Grandfather   ? ? ?Health Maintenance  ?Topic Date Due  ? HIV Screening  Never done  ? Hepatitis C Screening  Never done  ? Zoster Vaccines- Shingrix (1 of 2) Never done  ? COVID-19 Vaccine (2 - Booster for [employer] series) 02/08/2021  ? INFLUENZA VACCINE  02/18/2022  ? COLONOSCOPY (Pts 45-37yr Insurance coverage will need to be confirmed)  11/09/2023  ? TETANUS/TDAP  07/22/2027  ? HPV VACCINES  Aged Out  ? ? ? ?----------------------------------------------------------------------------------------------------------------------------------------------------------------------------------------------------------------- ?Physical Exam ?BP  107/69 (BP Location: Left Arm, Patient Position: Sitting, Cuff Size: Normal)   Pulse 68   Temp 98 ?F (36.7 ?C) (Oral)   Ht 6' (1.829 m)   Wt 190 lb (86.2 kg)   SpO2 99%   BMI 25.77 kg/m?  ? ?Physical Exam ?Constitutional:   ?   General: He is not in acute distress. ?HENT:  ?   Head: Normocephalic and atraumatic.  ?   Right Ear: Tympanic membrane and external ear normal.  ?   Left Ear: Tympanic membrane and external ear normal.  ?Eyes:  ?   General: No scleral icterus. ?Neck:  ?   Thyroid: No thyromegaly.  ?Cardiovascular:  ?   Rate and Rhythm: Normal rate and regular rhythm.  ?   Heart sounds: Normal heart sounds.  ?Pulmonary:  ?   Effort: Pulmonary effort is normal.  ?   Breath sounds: Normal breath sounds.  ?Abdominal:  ?   General: Bowel sounds are normal. There is no distension.  ?   Palpations: Abdomen is soft.  ?   Tenderness: There is no abdominal tenderness. There is no guarding.  ?Musculoskeletal:  ?   Cervical back: Normal range of motion.  ?Lymphadenopathy:  ?   Cervical: No cervical adenopathy.  ?Skin: ?   General: Skin is warm and dry.  ?   Findings: No rash.  ?Neurological:  ?   Mental Status: He is alert and oriented to person, place, and time.  ?   Cranial Nerves: No cranial nerve deficit.  ?   Motor: No abnormal muscle tone.  ?Psychiatric:     ?   Mood and Affect: Mood normal.     ?   Behavior: Behavior normal.  ? ? ?------------------------------------------------------------------------------------------------------------------------------------------------------------------------------------------------------------------- ?Assessment and Plan ? ?Well adult exam ?Well adult ?Orders Placed This Encounter  ?Procedures  ? Varicella-zoster vaccine IM (Shingrix)  ? COMPLETE METABOLIC PANEL WITH GFR  ? CBC with Differential  ? Lipid Panel w/reflex Direct LDL  ? TSH  ? PSA  ?Screenings: Per lab orders.  ?Immunizations:  Shingrix #1 given today.  ?Anticipatory guidance/Risk factor reduction:  Recommendations per AVS. ? ? ?Meds ordered this encounter  ?Medications  ? azelastine (ASTELIN) 0.1 % nasal spray  ?  Sig: Place 2 sprays into both nostrils 2 (two) times daily. Use in each nostril as directed  ?  Dispense:  30 mL  ?  Refill:  12  ? ? ?No follow-ups on file. ? ? ? ?This visit occurred during the SARS-CoV-2 public health emergency.  Safety protocols were in place, including screening questions prior to the visit, additional usage of staff PPE, and extensive cleaning of exam room while observing appropriate contact time as indicated for disinfecting  solutions.  ? ?

## 2021-11-25 NOTE — Assessment & Plan Note (Signed)
Well adult ?Orders Placed This Encounter  ?Procedures  ?? Varicella-zoster vaccine IM (Shingrix)  ?? COMPLETE METABOLIC PANEL WITH GFR  ?? CBC with Differential  ?? Lipid Panel w/reflex Direct LDL  ?? TSH  ?? PSA  ?Screenings: Per lab orders.  ?Immunizations:  Shingrix #1 given today.  ?Anticipatory guidance/Risk factor reduction: Recommendations per AVS. ?

## 2021-11-25 NOTE — Patient Instructions (Addendum)
Stop flonase and try azelastine nasal spray.  ? ? ?Preventive Care 64-58 Years Old, Male ?Preventive care refers to lifestyle choices and visits with your health care provider that can promote health and wellness. Preventive care visits are also called wellness exams. ?What can I expect for my preventive care visit? ?Counseling ?During your preventive care visit, your health care provider may ask about your: ?Medical history, including: ?Past medical problems. ?Family medical history. ?Current health, including: ?Emotional well-being. ?Home life and relationship well-being. ?Sexual activity. ?Lifestyle, including: ?Alcohol, nicotine or tobacco, and drug use. ?Access to firearms. ?Diet, exercise, and sleep habits. ?Safety issues such as seatbelt and bike helmet use. ?Sunscreen use. ?Work and work Statistician. ?Physical exam ?Your health care provider will check your: ?Height and weight. These may be used to calculate your BMI (body mass index). BMI is a measurement that tells if you are at a healthy weight. ?Waist circumference. This measures the distance around your waistline. This measurement also tells if you are at a healthy weight and may help predict your risk of certain diseases, such as type 2 diabetes and high blood pressure. ?Heart rate and blood pressure. ?Body temperature. ?Skin for abnormal spots. ?What immunizations do I need? ? ?Vaccines are usually given at various ages, according to a schedule. Your health care provider will recommend vaccines for you based on your age, medical history, and lifestyle or other factors, such as travel or where you work. ?What tests do I need? ?Screening ?Your health care provider may recommend screening tests for certain conditions. This may include: ?Lipid and cholesterol levels. ?Diabetes screening. This is done by checking your blood sugar (glucose) after you have not eaten for a while (fasting). ?Hepatitis B test. ?Hepatitis C test. ?HIV (human immunodeficiency  virus) test. ?STI (sexually transmitted infection) testing, if you are at risk. ?Lung cancer screening. ?Prostate cancer screening. ?Colorectal cancer screening. ?Talk with your health care provider about your test results, treatment options, and if necessary, the need for more tests. ?Follow these instructions at home: ?Eating and drinking ? ?Eat a diet that includes fresh fruits and vegetables, whole grains, lean protein, and low-fat dairy products. ?Take vitamin and mineral supplements as recommended by your health care provider. ?Do not drink alcohol if your health care provider tells you not to drink. ?If you drink alcohol: ?Limit how much you have to 0-2 drinks a day. ?Know how much alcohol is in your drink. In the [place] one drink equals one 12 oz bottle of beer (355 mL), one 5 oz glass of wine (148 mL), or one 1? oz glass of hard liquor (44 mL). ?Lifestyle ?Brush your teeth every morning and night with fluoride toothpaste. Floss one time each day. ?Exercise for at least 30 minutes 5 or more days each week. ?Do not use any products that contain nicotine or tobacco. These products include cigarettes, chewing tobacco, and vaping devices, such as e-cigarettes. If you need help quitting, ask your health care provider. ?Do not use drugs. ?If you are sexually active, practice safe sex. Use a condom or other form of protection to prevent STIs. ?Take aspirin only as told by your health care provider. Make sure that you understand how much to take and what form to take. Work with your health care provider to find out whether it is safe and beneficial for you to take aspirin daily. ?Find healthy ways to manage stress, such as: ?Meditation, yoga, or listening to music. ?Journaling. ?Talking to a trusted person. ?Spending time with  friends and family. ?Minimize exposure to UV radiation to reduce your risk of skin cancer. ?Safety ?Always wear your seat belt while driving or riding in a vehicle. ?Do not drive: ?If you have  been drinking alcohol. Do not ride with someone who has been drinking. ?When you are tired or distracted. ?While texting. ?If you have been using any mind-altering substances or drugs. ?Wear a helmet and other protective equipment during sports activities. ?If you have firearms in your house, make sure you follow all gun safety procedures. ?What's next? ?Go to your health care provider once a year for an annual wellness visit. ?Ask your health care provider how often you should have your eyes and teeth checked. ?Stay up to date on all vaccines. ?This information is not intended to replace advice given to you by your health care provider. Make sure you discuss any questions you have with your health care provider. ?Document Revised: 01/02/2021 Document Reviewed: 01/02/2021 ?Elsevier Patient Education ? [place]. ? ?

## 2021-11-26 LAB — CBC WITH DIFFERENTIAL/PLATELET
Absolute Monocytes: 445 cells/uL (ref 200–950)
Basophils Absolute: 41 cells/uL (ref 0–200)
Basophils Relative: 1.2 %
Eosinophils Absolute: 163 cells/uL (ref 15–500)
Eosinophils Relative: 4.8 %
HCT: 41 % (ref 38.5–50.0)
Hemoglobin: 14 g/dL (ref 13.2–17.1)
Lymphs Abs: 1357 cells/uL (ref 850–3900)
MCH: 32.6 pg (ref 27.0–33.0)
MCHC: 34.1 g/dL (ref 32.0–36.0)
MCV: 95.3 fL (ref 80.0–100.0)
MPV: 10.2 fL (ref 7.5–12.5)
Monocytes Relative: 13.1 %
Neutro Abs: 1394 cells/uL — ABNORMAL LOW (ref 1500–7800)
Neutrophils Relative %: 41 %
Platelets: 231 10*3/uL (ref 140–400)
RBC: 4.3 10*6/uL (ref 4.20–5.80)
RDW: 12.4 % (ref 11.0–15.0)
Total Lymphocyte: 39.9 %
WBC: 3.4 10*3/uL — ABNORMAL LOW (ref 3.8–10.8)

## 2021-11-26 LAB — COMPLETE METABOLIC PANEL WITH GFR
AG Ratio: 1.9 (calc) (ref 1.0–2.5)
ALT: 11 U/L (ref 9–46)
AST: 15 U/L (ref 10–35)
Albumin: 4.3 g/dL (ref 3.6–5.1)
Alkaline phosphatase (APISO): 48 U/L (ref 35–144)
BUN: 11 mg/dL (ref 7–25)
CO2: 27 mmol/L (ref 20–32)
Calcium: 9.5 mg/dL (ref 8.6–10.3)
Chloride: 105 mmol/L (ref 98–110)
Creat: 0.95 mg/dL (ref 0.70–1.30)
Globulin: 2.3 g/dL (calc) (ref 1.9–3.7)
Glucose, Bld: 98 mg/dL (ref 65–99)
Potassium: 4.6 mmol/L (ref 3.5–5.3)
Sodium: 139 mmol/L (ref 135–146)
Total Bilirubin: 0.9 mg/dL (ref 0.2–1.2)
Total Protein: 6.6 g/dL (ref 6.1–8.1)
eGFR: 93 mL/min/{1.73_m2} (ref >=60)

## 2021-11-26 LAB — PSA: PSA: 0.23 ng/mL (ref <=4.00)

## 2021-11-26 LAB — TSH: TSH: 0.59 mIU/L (ref 0.40–4.50)

## 2021-11-26 LAB — LIPID PANEL W/REFLEX DIRECT LDL
Cholesterol: 203 mg/dL — ABNORMAL HIGH (ref <200)
HDL: 47 mg/dL (ref >=40)
LDL Cholesterol (Calc): 130 mg/dL (calc) — ABNORMAL HIGH
Non-HDL Cholesterol (Calc): 156 mg/dL (calc) — ABNORMAL HIGH (ref <130)
Total CHOL/HDL Ratio: 4.3 (calc) (ref <5.0)
Triglycerides: 138 mg/dL (ref <150)

## 2021-11-29 ENCOUNTER — Encounter: Payer: Self-pay | Admitting: Family Medicine

## 2021-12-02 ENCOUNTER — Other Ambulatory Visit: Payer: Self-pay | Admitting: Family Medicine

## 2021-12-02 MED ORDER — TERBINAFINE HCL 250 MG PO TABS
250.0000 mg | ORAL_TABLET | Freq: Every day | ORAL | 0 refills | Status: AC
Start: 2021-12-02 — End: 2022-02-24

## 2022-02-05 ENCOUNTER — Encounter: Payer: Self-pay | Admitting: Family Medicine

## 2022-02-06 ENCOUNTER — Encounter: Payer: Self-pay | Admitting: Family Medicine

## 2022-02-06 ENCOUNTER — Ambulatory Visit (INDEPENDENT_AMBULATORY_CARE_PROVIDER_SITE_OTHER): Payer: 59 | Admitting: Family Medicine

## 2022-02-06 VITALS — BP 97/65 | HR 96 | Ht 72.0 in | Wt 192.0 lb

## 2022-02-06 DIAGNOSIS — R1013 Epigastric pain: Secondary | ICD-10-CM | POA: Insufficient documentation

## 2022-02-06 DIAGNOSIS — B351 Tinea unguium: Secondary | ICD-10-CM | POA: Diagnosis not present

## 2022-02-06 NOTE — Progress Notes (Signed)
Joel Owen - 56 y.o. male MRN [id number]  Date of birth: 05-Dec-1965  Subjective No chief complaint on file.   HPI Joel Owen is a 56 y.o. male here today with complaint of abdominal pain.  Pain is located in the epigastric area.  He has had this for about 1 week.  He is taking terbinafine for onychomycosis.  Initial LFT's normal.  He has recently added nexium as well, taking for about 5 days.  He denies nausea or vomiting, bloody or dark stools.   ROS:  A comprehensive ROS was completed and negative except as noted per HPI  Allergies  Allergen Reactions   Codeine Other (See Comments)    itching    Past Medical History:  Diagnosis Date   Adjustment disorder    with depressed mood   Allergic state 09/04/2013   per pt/ not sure what reaction   BPH (benign prostatic hyperplasia)    Epicondylitis 09/07/2012   Insomnia    Rectal abscess    history of, s/p surgery   Thyroid disease    hypothyroidism    Past Surgical History:  Procedure Laterality Date   CARPAL TUNNEL RELEASE  02/07/2011   Dr  Tyrone Sage BIL   HAND SURGERY     2 screws in left hand   TONSILLECTOMY     1971    Social History   Socioeconomic History   Marital status: Married    Spouse name: Not on file   Number of children: 0   Years of education: Not on file   Highest education level: Not on file  Occupational History   Occupation: Dealer    Employer: [employer]  Tobacco Use   Smoking status: Former    Types: Cigarettes    Quit date: 10/25/1989    Years since quitting: 32.3   Smokeless tobacco: Former    Types: Chew  Substance and Sexual Activity   Alcohol use: Yes    Alcohol/week: 14.0 standard drinks of alcohol    Types: 14 Shots of liquor per week   Drug use: No   Sexual activity: Not on file  Other Topics Concern   Not on file  Social History Narrative   Pt is divorced.              Social Determinants of Health   Financial Resource Strain: Not on file  Food  Insecurity: Not on file  Transportation Needs: Not on file  Physical Activity: Not on file  Stress: Not on file  Social Connections: Not on file    Family History  Problem Relation Age of Onset   Hypertension Mother    Hyperlipidemia Mother    Gout Father    Arthritis Father    Memory loss Father    Diverticulitis Sister    Cholelithiasis Sister    Heart disease Maternal Grandfather     Health Maintenance  Topic Date Due   COVID-19 Vaccine (2 - Booster for [employer] series) 02/22/2022 (Originally 02/08/2021)   Zoster Vaccines- Shingrix (2 of 2) 05/09/2022 (Originally 01/20/2022)   Hepatitis C Screening  11/26/2022 (Originally 10/10/1981)   HIV Screening  11/26/2022 (Originally 10/11/1978)   INFLUENZA VACCINE  02/18/2022   COLONOSCOPY (Pts 45-50yr Insurance coverage will need to be confirmed)  11/09/2023   TETANUS/TDAP  07/22/2027   HPV VACCINES  Aged Out     ----------------------------------------------------------------------------------------------------------------------------------------------------------------------------------------------------------------- Physical Exam BP 97/65 (BP Location: Left Arm, Patient Position: Sitting, Cuff Size: Large)   Pulse 96  Ht 6' (1.829 m)   Wt 192 lb (87.1 kg)   SpO2 99%   BMI 26.04 kg/m   Physical Exam Constitutional:      Appearance: Normal appearance.  Eyes:     General: No scleral icterus. Abdominal:     General: Abdomen is flat. There is no distension.     Palpations: Abdomen is soft. There is no mass.     Tenderness: There is abdominal tenderness (mild epigastric tenderness). There is no guarding or rebound.  Neurological:     Mental Status: He is alert.     ------------------------------------------------------------------------------------------------------------------------------------------------------------------------------------------------------------------- Assessment and Plan  Epigastric pain Checking  LFT's and lipase today as he is on terbinafine.  Continue nexium at current strength.  Red flags reviewed.  He will let me know if not improving.    No orders of the defined types were placed in this encounter.   No follow-ups on file.    This visit occurred during the SARS-CoV-2 public health emergency.  Safety protocols were in place, including screening questions prior to the visit, additional usage of staff PPE, and extensive cleaning of exam room while observing appropriate contact time as indicated for disinfecting solutions.

## 2022-02-06 NOTE — Patient Instructions (Signed)
Continue nexium daily for the next 2 weeks. Let me know if not improving.

## 2022-02-06 NOTE — Telephone Encounter (Signed)
I called and left a message for patient to call back. I have scheduled him for today.

## 2022-02-06 NOTE — Assessment & Plan Note (Signed)
Checking LFT's and lipase today as he is on terbinafine.  Continue nexium at current strength.  Red flags reviewed.  He will let me know if not improving.

## 2022-02-07 LAB — HEPATIC FUNCTION PANEL
AG Ratio: 2.1 (calc) (ref 1.0–2.5)
ALT: 11 U/L (ref 9–46)
AST: 15 U/L (ref 10–35)
Albumin: 4.6 g/dL (ref 3.6–5.1)
Alkaline phosphatase (APISO): 52 U/L (ref 35–144)
Bilirubin, Direct: 0.1 mg/dL (ref 0.0–0.2)
Globulin: 2.2 g/dL (calc) (ref 1.9–3.7)
Indirect Bilirubin: 0.5 mg/dL (calc) (ref 0.2–1.2)
Total Bilirubin: 0.6 mg/dL (ref 0.2–1.2)
Total Protein: 6.8 g/dL (ref 6.1–8.1)

## 2022-02-07 LAB — LIPASE: Lipase: 11 U/L (ref 7–60)

## 2022-02-26 ENCOUNTER — Ambulatory Visit: Payer: 59

## 2022-02-26 ENCOUNTER — Ambulatory Visit (INDEPENDENT_AMBULATORY_CARE_PROVIDER_SITE_OTHER): Payer: 59 | Admitting: Family Medicine

## 2022-02-26 DIAGNOSIS — Z23 Encounter for immunization: Secondary | ICD-10-CM | POA: Diagnosis not present

## 2022-02-26 NOTE — Progress Notes (Signed)
   Established Patient Office Visit  Subjective   Patient ID: Joel Owen, male    DOB: Nov 27, 1965  Age: 56 y.o. MRN: [id number]  Chief Complaint  Patient presents with   Immunizations    HPI  Joel Owen is here for last shingles vaccine.   ROS    Objective:     There were no vitals taken for this visit.   Physical Exam   No results found for any visits on 02/26/22.    The 10-year ASCVD risk score (Arnett DK, et al., 2019) is: 4.8%    Assessment & Plan:  Vaccine - Patient tolerated injection well without complications.   Problem List Items Addressed This Visit   None Visit Diagnoses     Need for shingles vaccine    -  Primary   Relevant Orders   Varicella-zoster vaccine IM (Completed)       No follow-ups on file.    Joel Owen, [place]

## 2022-02-26 NOTE — Progress Notes (Signed)
Medical screening examination/treatment was performed by qualified clinical staff member and as supervising physician I was immediately available for consultation/collaboration. I have reviewed documentation and agree with assessment and plan.  Geraldy Akridge, DO  

## 2022-03-22 IMAGING — DX DG ELBOW COMPLETE 3+V*L*
5 series · 5 of 5 positions shown · non-contrast
Comparison: None.

CLINICAL DATA: Left elbow pain

EXAM:
LEFT ELBOW - COMPLETE 3+ VIEW

[elbow ap]
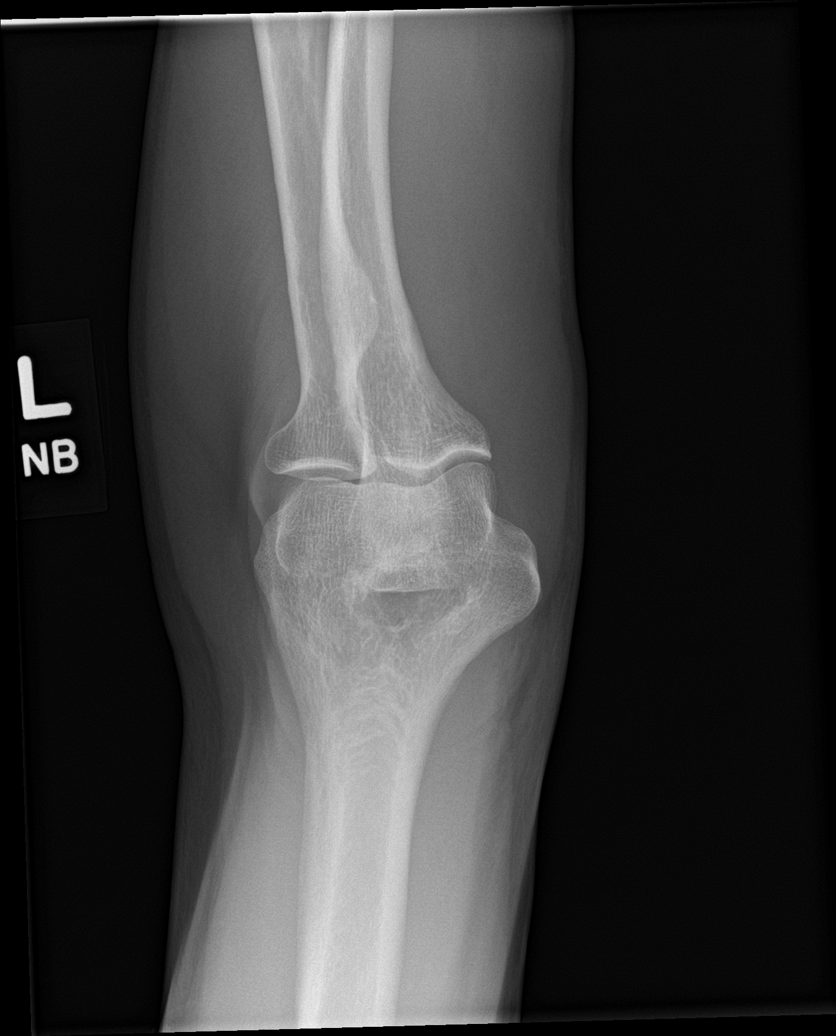

[elbow obl (1 of 3)]
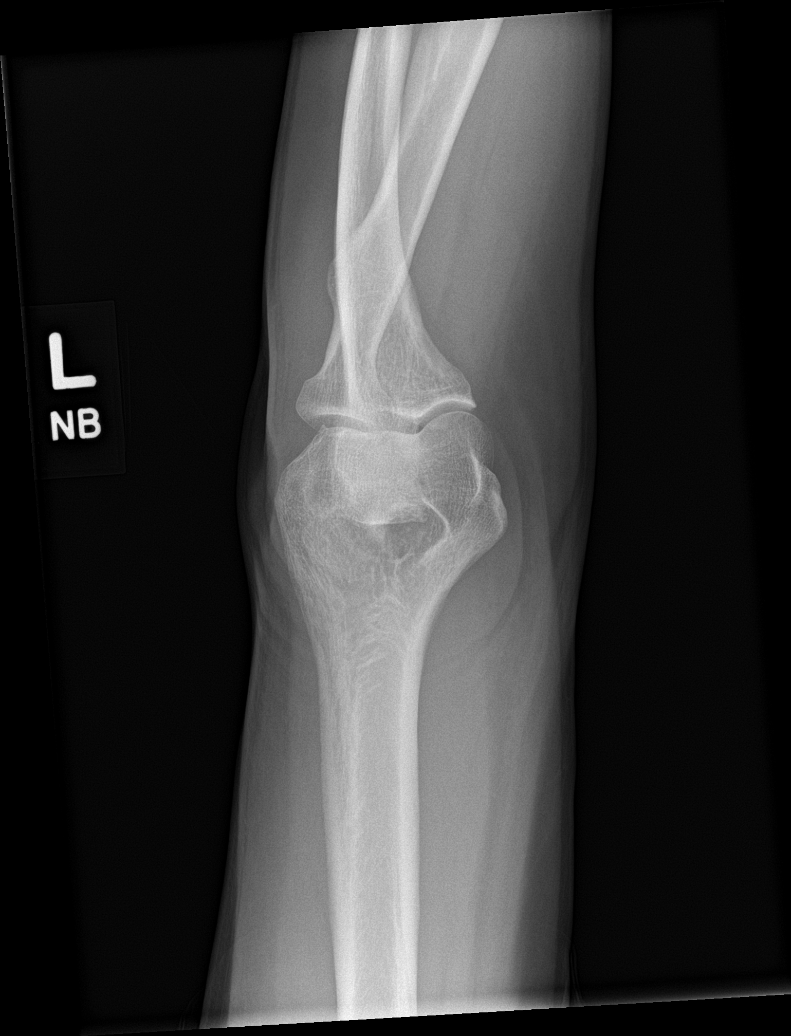

[elbow obl (2 of 3)]
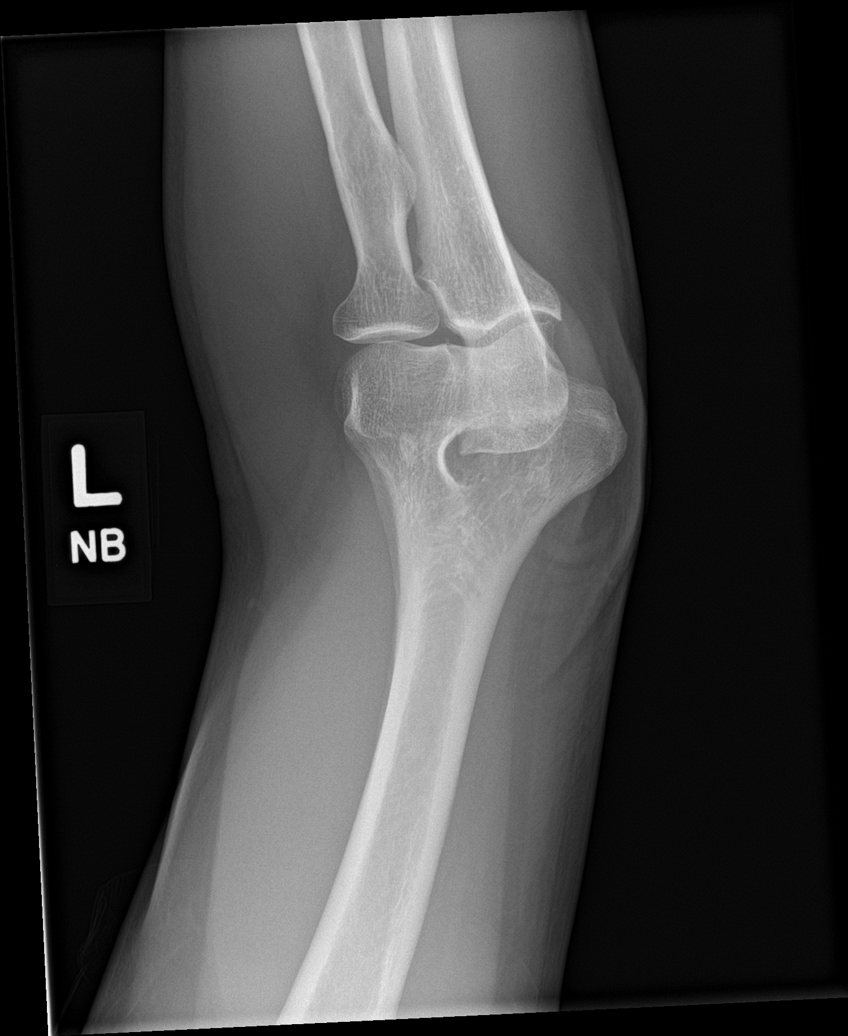

[elbow lat]
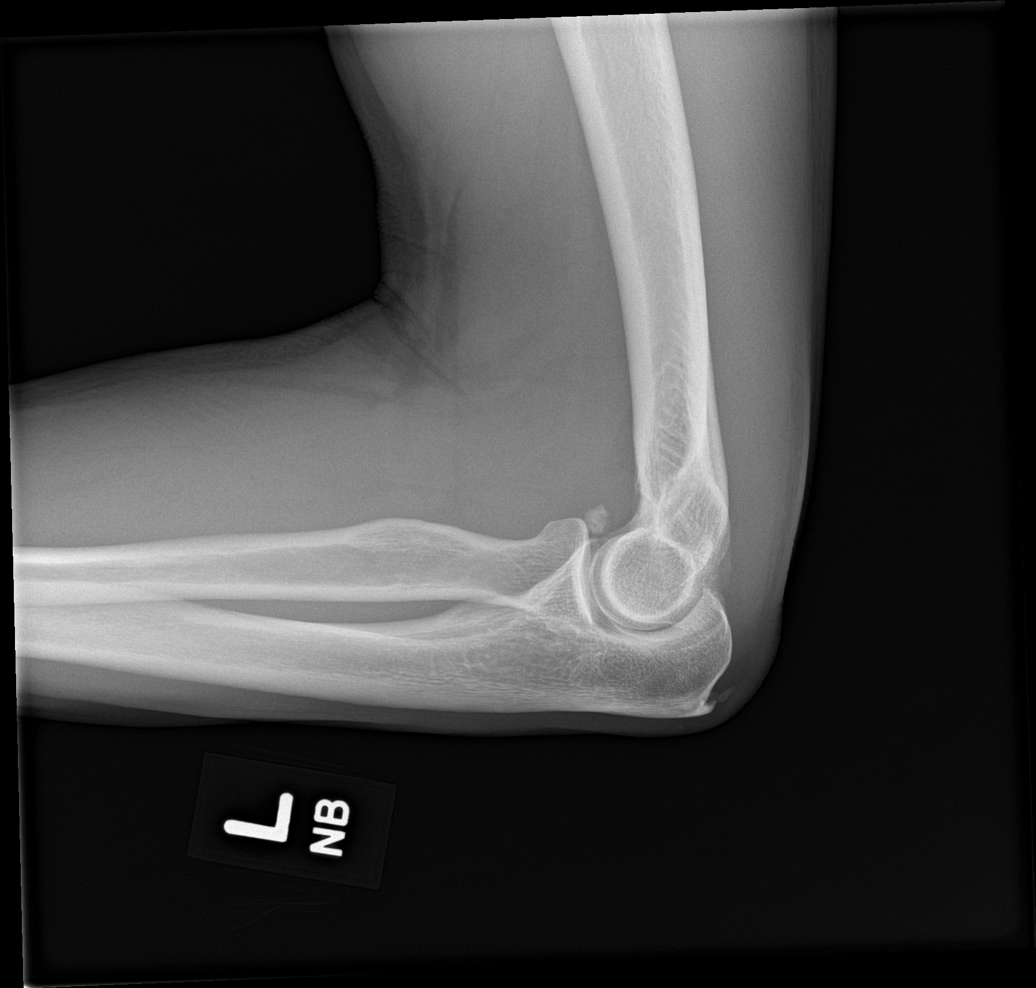

[elbow obl (3 of 3)]
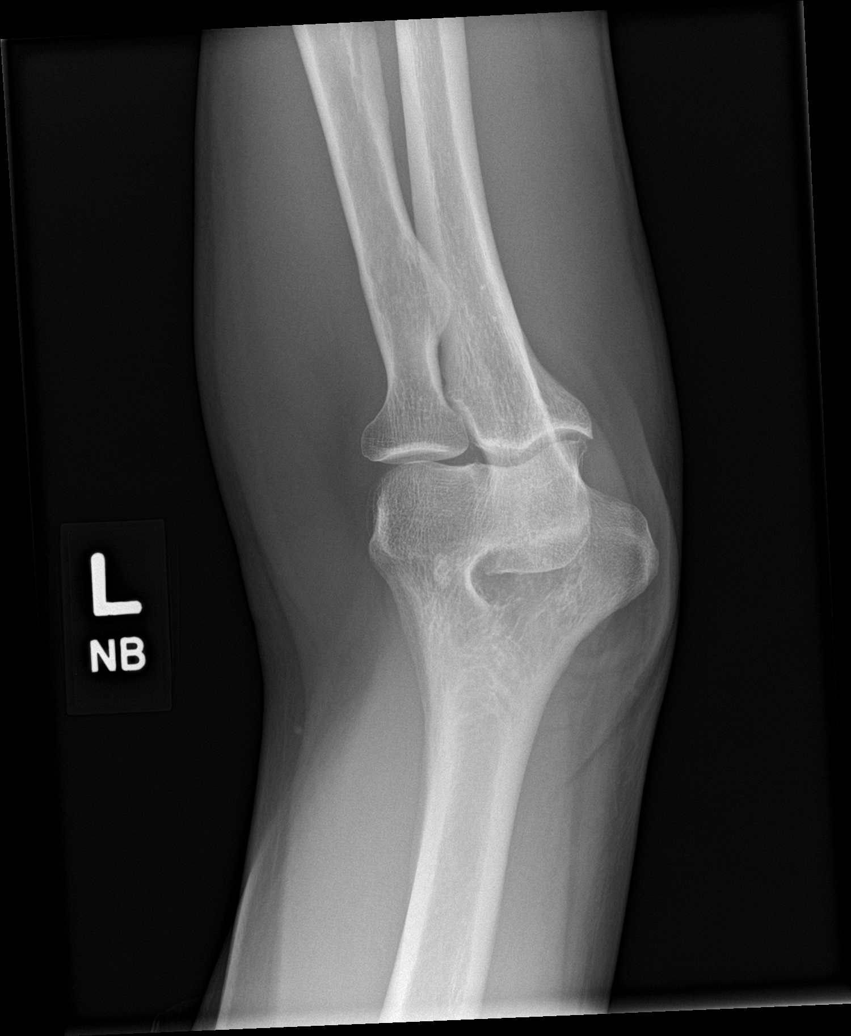

[5 of 5 positions shown; findings below may reference images not displayed]

FINDINGS: No acute fracture or dislocation. Normal alignment. Joint spaces are
preserved. A 7 mm calcification is seen anterior to the a distal
left humeral metaphyseal region possibly representing an
intra-articular loose body. Small left elbow effusion is present.
There is degenerative enthesopathy involving the insertion of the
triceps tendon upon the olecranon.
IMPRESSION: Possible 7 mm intra-articular loose body. Small associated left
elbow effusion. This could be confirmed with MRI examination, if
indicated.

## 2022-05-21 ENCOUNTER — Other Ambulatory Visit: Payer: Self-pay | Admitting: Family Medicine

## 2022-05-27 ENCOUNTER — Encounter: Payer: Self-pay | Admitting: Family Medicine

## 2022-05-27 ENCOUNTER — Ambulatory Visit (INDEPENDENT_AMBULATORY_CARE_PROVIDER_SITE_OTHER): Payer: 59 | Admitting: Family Medicine

## 2022-05-27 VITALS — BP 123/79 | HR 78 | Ht 72.0 in | Wt 179.0 lb

## 2022-05-27 DIAGNOSIS — R634 Abnormal weight loss: Secondary | ICD-10-CM

## 2022-05-27 DIAGNOSIS — E039 Hypothyroidism, unspecified: Secondary | ICD-10-CM

## 2022-05-27 DIAGNOSIS — R61 Generalized hyperhidrosis: Secondary | ICD-10-CM | POA: Diagnosis not present

## 2022-05-27 DIAGNOSIS — F5101 Primary insomnia: Secondary | ICD-10-CM

## 2022-05-27 MED ORDER — QUETIAPINE FUMARATE 50 MG PO TABS: 50.0000 mg | ORAL_TABLET | Freq: Every day | ORAL | 3 refills | Status: DC

## 2022-05-27 NOTE — Patient Instructions (Signed)
We'll be in touch with lab results.  Let's try seroquel at bedtime,  start with '50mg'$  at night.  You can increase to '100mg'$  if needed.  Let me know if not improving with this.

## 2022-05-27 NOTE — Assessment & Plan Note (Signed)
Over-the-counter measures have not been effective.  Starting Seroquel 50 to 100 mg nightly as needed.
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
## 2022-05-27 NOTE — Assessment & Plan Note (Signed)
Orders Placed This Encounter  Procedures   TSH   CBC with Differential   COMPLETE METABOLIC PANEL WITH GFR  These have improved over the past few days.

## 2022-05-27 NOTE — Assessment & Plan Note (Signed)
He has had some increased night sweats and weight loss.  Updating TSH.

## 2022-05-28 LAB — COMPLETE METABOLIC PANEL WITH GFR
AG Ratio: 2.1 (calc) (ref 1.0–2.5)
ALT: 13 U/L (ref 9–46)
AST: 17 U/L (ref 10–35)
Albumin: 4.7 g/dL (ref 3.6–5.1)
Alkaline phosphatase (APISO): 59 U/L (ref 35–144)
BUN: 11 mg/dL (ref 7–25)
CO2: 29 mmol/L (ref 20–32)
Calcium: 9.9 mg/dL (ref 8.6–10.3)
Chloride: 106 mmol/L (ref 98–110)
Creat: 0.92 mg/dL (ref 0.70–1.30)
Globulin: 2.2 g/dL (calc) (ref 1.9–3.7)
Glucose, Bld: 109 mg/dL — ABNORMAL HIGH (ref 65–99)
Potassium: 5.1 mmol/L (ref 3.5–5.3)
Sodium: 143 mmol/L (ref 135–146)
Total Bilirubin: 0.9 mg/dL (ref 0.2–1.2)
Total Protein: 6.9 g/dL (ref 6.1–8.1)
eGFR: 96 mL/min/{1.73_m2} (ref >=60)

## 2022-05-28 LAB — CBC WITH DIFFERENTIAL/PLATELET
Absolute Monocytes: 356 cells/uL (ref 200–950)
Basophils Absolute: 40 cells/uL (ref 0–200)
Basophils Relative: 1.2 %
Eosinophils Absolute: 139 cells/uL (ref 15–500)
Eosinophils Relative: 4.2 %
HCT: 40.1 % (ref 38.5–50.0)
Hemoglobin: 13.8 g/dL (ref 13.2–17.1)
Lymphs Abs: 1412 cells/uL (ref 850–3900)
MCH: 32.6 pg (ref 27.0–33.0)
MCHC: 34.4 g/dL (ref 32.0–36.0)
MCV: 94.8 fL (ref 80.0–100.0)
MPV: 10.3 fL (ref 7.5–12.5)
Monocytes Relative: 10.8 %
Neutro Abs: 1353 cells/uL — ABNORMAL LOW (ref 1500–7800)
Neutrophils Relative %: 41 %
Platelets: 241 10*3/uL (ref 140–400)
RBC: 4.23 10*6/uL (ref 4.20–5.80)
RDW: 11.9 % (ref 11.0–15.0)
Total Lymphocyte: 42.8 %
WBC: 3.3 10*3/uL — ABNORMAL LOW (ref 3.8–10.8)

## 2022-05-28 LAB — TSH: TSH: 0.65 mIU/L (ref 0.40–4.50)

## 2022-05-30 ENCOUNTER — Other Ambulatory Visit: Payer: Self-pay | Admitting: Family Medicine

## 2022-05-30 DIAGNOSIS — D709 Neutropenia, unspecified: Secondary | ICD-10-CM

## 2022-05-30 DIAGNOSIS — R634 Abnormal weight loss: Secondary | ICD-10-CM

## 2022-06-04 ENCOUNTER — Ambulatory Visit: Payer: 59 | Admitting: Family Medicine

## 2022-06-17 ENCOUNTER — Inpatient Hospital Stay: Payer: 59 | Attending: Hematology & Oncology

## 2022-06-17 ENCOUNTER — Inpatient Hospital Stay (HOSPITAL_BASED_OUTPATIENT_CLINIC_OR_DEPARTMENT_OTHER): Payer: 59 | Admitting: Family

## 2022-06-17 ENCOUNTER — Encounter: Payer: Self-pay | Admitting: Family

## 2022-06-17 ENCOUNTER — Other Ambulatory Visit: Payer: Self-pay | Admitting: Family

## 2022-06-17 VITALS — BP 146/94 | HR 78 | Temp 98.0°F | Resp 18 | Ht 70.5 in | Wt 175.8 lb

## 2022-06-17 DIAGNOSIS — D709 Neutropenia, unspecified: Secondary | ICD-10-CM

## 2022-06-17 DIAGNOSIS — R61 Generalized hyperhidrosis: Secondary | ICD-10-CM

## 2022-06-17 LAB — CBC WITH DIFFERENTIAL (CANCER CENTER ONLY)
Abs Immature Granulocytes: 0.01 10*3/uL (ref 0.00–0.07)
Basophils Absolute: 0.1 10*3/uL (ref 0.0–0.1)
Basophils Relative: 1 %
Eosinophils Absolute: 0.1 10*3/uL (ref 0.0–0.5)
Eosinophils Relative: 1 %
HCT: 39.3 % (ref 39.0–52.0)
Hemoglobin: 13.4 g/dL (ref 13.0–17.0)
Immature Granulocytes: 0 %
Lymphocytes Relative: 32 %
Lymphs Abs: 1.6 10*3/uL (ref 0.7–4.0)
MCH: 32.8 pg (ref 26.0–34.0)
MCHC: 34.1 g/dL (ref 30.0–36.0)
MCV: 96.3 fL (ref 80.0–100.0)
Monocytes Absolute: 0.4 10*3/uL (ref 0.1–1.0)
Monocytes Relative: 8 %
Neutro Abs: 2.8 10*3/uL (ref 1.7–7.7)
Neutrophils Relative %: 58 %
Platelet Count: 250 10*3/uL (ref 150–400)
RBC: 4.08 MIL/uL — ABNORMAL LOW (ref 4.22–5.81)
RDW: 11.9 % (ref 11.5–15.5)
WBC Count: 4.8 10*3/uL (ref 4.0–10.5)
nRBC: 0 % (ref 0.0–0.2)

## 2022-06-17 LAB — SAVE SMEAR(SSMR), FOR PROVIDER SLIDE REVIEW

## 2022-06-17 LAB — LACTATE DEHYDROGENASE: LDH: 125 U/L (ref 98–192)

## 2022-06-17 NOTE — Progress Notes (Signed)
Hematology/Oncology Consultation   Name: Joel Owen      MRN: [id number]    Location: Room/bed info not found  Date: 06/17/2022 Time:3:35 PM   REFERRING PHYSICIAN:  Luetta Nutting, DO  REASON FOR CONSULT: Neutropenia, unspecified    DIAGNOSIS: Mild neutropenia, resolved   HISTORY OF PRESENT ILLNESS: Mr. Joel Owen is a 56 yo caucasian gentleman with recent neutropenia noted with PCP. Today this appears to have resolved.  WBC count is 4.8, neutrophils 58%, lymphocytes 32%, monocytes 8% and basophils 1%.  He states that he lost his appetite while on Terbinafine and feels this is the cause of his weight loss (15 lbs in 6 months).  He had been taking a natural herbal supplement to help him sleep and feels that this is what caused his other symptoms of fatigue, dizziness and hot flashes/night sweats.  These symptoms have all resolved since he stopped the supplement.   He has had no issue with frequent or recurrent infections. No fever, chills, n/v, cough, rash, dizziness, SOB, chest pain, palpitations, abdominal pain or changes in bowel or bladder habits.  No fatigue or weakness. He stays quite active.  No adenopathy or lymphedema.  No history of diabetes. He does have hypothyroidism and takes Synthroid daily.  No personal or known familial history of cancer.  He had his colonoscopy in 2015 with Dr. Carlean Purl. He states that he had 1 benign polyp removed and did have some mild diverticulosis at the time. He states that he is due again in 2025.  He has arthritic pain in his joints and chronic lower back pain secondary to DDD.  He notes numbness and tingling in the both pinky and ring fingers secondary to ulnar nerve issues and remote history of neck injury. He states that he sees a chiropractor regularly to help with this.  No smoking. He does have a shot of vodka each night to help him sleep. He also enjoys 2-3 bees on his days off.  Appetite and hydration are improved. He is a Copywriter, advertising. Weight is  175 lbs.   ROS: All other 10 point review of systems is negative.   PAST MEDICAL HISTORY:   Past Medical History:  Diagnosis Date   Adjustment disorder    with depressed mood   Allergic state 09/04/2013   per pt/ not sure what reaction   BPH (benign prostatic hyperplasia)    Epicondylitis 09/07/2012   Insomnia    Rectal abscess    history of, s/p surgery   Thyroid disease    hypothyroidism    ALLERGIES: Allergies  Allergen Reactions   Codeine Other (See Comments)    itching   Prednisone Other (See Comments)    Declines using this RX - unable to sleep      MEDICATIONS:  Current Outpatient Medications on File Prior to Visit  Medication Sig Dispense Refill   azelastine (ASTELIN) 0.1 % nasal spray Place 2 sprays into both nostrils 2 (two) times daily. Use in each nostril as directed 30 mL 12   levothyroxine (SYNTHROID) 100 MCG tablet TAKE 1 TABLET BY MOUTH EVERY DAY 90 tablet 1   Multiple Vitamin (MULTIVITAMIN) tablet Take 1 tablet by mouth daily.     QUEtiapine (SEROQUEL) 50 MG tablet Take 1 tablet (50 mg total) by mouth at bedtime. 30 tablet 3   sildenafil (VIAGRA) 100 MG tablet TAKE 1 TABLET BY MOUTH EVERY DAY AS NEEDED FOR ERECTILE DYSFUNCTION 6 tablet 3   No current facility-administered medications on file prior to  visit.     PAST SURGICAL HISTORY Past Surgical History:  Procedure Laterality Date   CARPAL TUNNEL RELEASE  02/07/2011   Dr  Tyrone Sage BIL   HAND SURGERY     2 screws in left hand   TONSILLECTOMY     1971    FAMILY HISTORY: Family History  Problem Relation Age of Onset   Hypertension Mother    Hyperlipidemia Mother    Gout Father    Arthritis Father    Memory loss Father    Diverticulitis Sister    Cholelithiasis Sister    Heart disease Maternal Grandfather     SOCIAL HISTORY:  reports that he quit smoking about 32 years ago. His smoking use included cigarettes. He has quit using smokeless tobacco.  His smokeless tobacco use included  chew. He reports current alcohol use of about 14.0 standard drinks of alcohol per week. He reports that he does not use drugs.  PERFORMANCE STATUS: The patient's performance status is 0 - Asymptomatic  PHYSICAL EXAM: Most Recent Vital Signs: Blood pressure (!) 146/94, pulse 78, temperature 98 F (36.7 C), temperature source Oral, resp. rate 18, height 5' 10.5" (1.791 m), weight 175 lb 12.8 oz (79.7 kg), SpO2 100 %. BP (!) 146/94 (BP Location: Left Arm, Patient Position: Sitting)   Pulse 78   Temp 98 F (36.7 C) (Oral)   Resp 18   Ht 5' 10.5" (1.791 m)   Wt 175 lb 12.8 oz (79.7 kg)   SpO2 100%   BMI 24.87 kg/m   General Appearance:    Alert, cooperative, no distress, appears stated age  Head:    Normocephalic, without obvious abnormality, atraumatic  Eyes:    PERRL, conjunctiva/corneas clear, EOM's intact, fundi    benign, both eyes             Throat:   Lips, mucosa, and tongue normal; teeth and gums normal  Neck:   Supple, symmetrical, trachea midline, no adenopathy;       thyroid:  No enlargement/tenderness/nodules; no carotid   bruit or JVD  Back:     Symmetric, no curvature, ROM normal, no CVA tenderness  Lungs:     Clear to auscultation bilaterally, respirations unlabored  Chest wall:    No tenderness or deformity  Heart:    Regular rate and rhythm, S1 and S2 normal, no murmur, rub   or gallop  Abdomen:     Soft, non-tender, bowel sounds active all four quadrants,    no masses, no organomegaly        Extremities:   Extremities normal, atraumatic, no cyanosis or edema  Pulses:   2+ and symmetric all extremities  Skin:   Skin color, texture, turgor normal, no rashes or lesions  Lymph nodes:   Cervical, supraclavicular, and axillary nodes normal  Neurologic:   CNII-XII intact. Normal strength, sensation and reflexes      throughout    LABORATORY DATA:  Results for orders placed or performed in visit on 06/17/22 (from the past 48 hour(s))  CBC with Differential ([place] Only)     Status: Abnormal   Collection Time: 06/17/22  3:15 PM  Result Value Ref Range   WBC Count 4.8 4.0 - 10.5 K/uL   RBC 4.08 (L) 4.22 - 5.81 MIL/uL   Hemoglobin 13.4 13.0 - 17.0 g/dL   HCT 39.3 39.0 - 52.0 %   MCV 96.3 80.0 - 100.0 fL   MCH 32.8 26.0 - 34.0 pg   MCHC  34.1 30.0 - 36.0 g/dL   RDW 11.9 11.5 - 15.5 %   Platelet Count 250 150 - 400 K/uL   nRBC 0.0 0.0 - 0.2 %   Neutrophils Relative % 58 %   Neutro Abs 2.8 1.7 - 7.7 K/uL   Lymphocytes Relative 32 %   Lymphs Abs 1.6 0.7 - 4.0 K/uL   Monocytes Relative 8 %   Monocytes Absolute 0.4 0.1 - 1.0 K/uL   Eosinophils Relative 1 %   Eosinophils Absolute 0.1 0.0 - 0.5 K/uL   Basophils Relative 1 %   Basophils Absolute 0.1 0.0 - 0.1 K/uL   Immature Granulocytes 0 %   Abs Immature Granulocytes 0.01 0.00 - 0.07 K/uL    Comment: Performed at [facility] at [facility], [address], [place], [place] [address]  Save Smear for Provider Slide Review     Status: None   Collection Time: 06/17/22  3:15 PM  Result Value Ref Range   Smear Review SMEAR STAINED AND AVAILABLE FOR REVIEW     Comment: Performed at [facility] at [facility], [address] [place], [place] [address]      RADIOGRAPHY: No results found.     PATHOLOGY: None  ASSESSMENT/PLAN: Mr. Jozwiak is a 56 yo caucasian gentleman with recent neutropenia noted with PCP.  He remains asymptomatic and counts have resolved.  Blood work reviewed with Dr. Marin Olp and no abnormality or evidence of malignancy noted. No intervention needed.  No follow-up needed. We will release the patient back to his PCP.   All questions were answered. The patient knows to call the clinic with any problems, questions or concerns. We can certainly see the patient much sooner if necessary.  The patient was discussed with Dr. Marin Olp and he is in agreement with the aforementioned.   Lottie Dawson, NP

## 2022-06-18 ENCOUNTER — Other Ambulatory Visit: Payer: Self-pay | Admitting: Family Medicine

## 2022-06-18 LAB — CMP (CANCER CENTER ONLY)
ALT: 18 U/L (ref 0–44)
AST: 24 U/L (ref 15–41)
Albumin: 4.3 g/dL (ref 3.5–5.0)
Alkaline Phosphatase: 49 U/L (ref 38–126)
Anion gap: 12 (ref 5–15)
BUN: 10 mg/dL (ref 6–20)
CO2: 24 mmol/L (ref 22–32)
Calcium: 9.5 mg/dL (ref 8.9–10.3)
Chloride: 106 mmol/L (ref 98–111)
Creatinine: 0.86 mg/dL (ref 0.61–1.24)
GFR, Estimated: >60 mL/min (ref >60)
Glucose, Bld: 111 mg/dL — ABNORMAL HIGH (ref 70–99)
Potassium: 4.7 mmol/L (ref 3.5–5.1)
Sodium: 142 mmol/L (ref 135–145)
Total Bilirubin: 0.9 mg/dL (ref 0.3–1.2)
Total Protein: 7.1 g/dL (ref 6.5–8.1)

## 2022-06-20 ENCOUNTER — Telehealth: Payer: Self-pay | Admitting: *Deleted

## 2022-06-20 NOTE — Telephone Encounter (Signed)
-----   Message from Celso Amy, NP sent at 06/20/2022  2:45 PM EST ----- Labs reviewed with Dr. Marin Olp. Everything looks good!!! No follow-up needed.   Sarah  ----- Message ----- From: Buel Ream, Lab In [place] Sent: 06/17/2022   3:23 PM EST To: Celso Amy, NP

## 2022-06-20 NOTE — Telephone Encounter (Signed)
Notified pt of results. No concerns at this time. Pt verbalized understanding, no follow up needed.

## 2022-06-23 ENCOUNTER — Encounter: Payer: Self-pay | Admitting: Family Medicine

## 2022-06-23 DIAGNOSIS — Z1283 Encounter for screening for malignant neoplasm of skin: Secondary | ICD-10-CM

## 2022-06-23 NOTE — Telephone Encounter (Signed)
Referral signed.

## 2022-06-30 ENCOUNTER — Telehealth: Payer: Self-pay | Admitting: *Deleted

## 2022-06-30 NOTE — Telephone Encounter (Signed)
Per 06/17/22 LOS - No follow-up needed.

## 2022-08-18 ENCOUNTER — Other Ambulatory Visit: Payer: Self-pay | Admitting: Family Medicine

## 2022-09-16 ENCOUNTER — Other Ambulatory Visit: Payer: Self-pay | Admitting: Family Medicine

## 2022-10-09 ENCOUNTER — Other Ambulatory Visit: Payer: Self-pay | Admitting: Family Medicine

## 2022-11-12 ENCOUNTER — Other Ambulatory Visit: Payer: Self-pay | Admitting: Family Medicine

## 2022-11-27 ENCOUNTER — Ambulatory Visit (INDEPENDENT_AMBULATORY_CARE_PROVIDER_SITE_OTHER): Payer: 59 | Admitting: Family Medicine

## 2022-11-27 ENCOUNTER — Encounter: Payer: Self-pay | Admitting: Family Medicine

## 2022-11-27 VITALS — BP 130/81 | HR 65 | Ht 70.5 in | Wt 195.0 lb

## 2022-11-27 DIAGNOSIS — E785 Hyperlipidemia, unspecified: Secondary | ICD-10-CM | POA: Diagnosis not present

## 2022-11-27 DIAGNOSIS — Z0000 Encounter for general adult medical examination without abnormal findings: Secondary | ICD-10-CM

## 2022-11-27 DIAGNOSIS — N400 Enlarged prostate without lower urinary tract symptoms: Secondary | ICD-10-CM

## 2022-11-27 DIAGNOSIS — E039 Hypothyroidism, unspecified: Secondary | ICD-10-CM

## 2022-11-27 LAB — CBC WITH DIFFERENTIAL/PLATELET
Eosinophils Relative: 3.3 %
MCH: 32.9 pg (ref 27.0–33.0)
RBC: 4.26 10*6/uL (ref 4.20–5.80)
WBC: 4.5 10*3/uL (ref 3.8–10.8)

## 2022-11-27 NOTE — Patient Instructions (Signed)

## 2022-11-27 NOTE — Assessment & Plan Note (Addendum)
Well adult Orders Placed This Encounter  Procedures   COMPLETE METABOLIC PANEL WITH GFR   CBC with Differential   Lipid Panel w/reflex Direct LDL   TSH   PSA  Screenings: per lab orders Immunizations:  UTD Anticipatory guidance/risk factor reduction: Recommendations per AVS.

## 2022-11-27 NOTE — Progress Notes (Signed)
[name] - 57 y.o. male MRN [id number]  Date of birth: 1965/09/10  Subjective Chief Complaint  Patient presents with   Annual Exam    HPI Joel Owen is a 57 y.o. male here today for annual exam.   He reports that he is doing wel.  Seen by dermatology earlier this year and had some areas frozen off.   Denies new concerns today.   He is staying pretty active.  He feels like diet is pretty good at this time.   Consumes EtOH a few days per week.  Non-smoker at this time.   He is UTD on immunizations and colon cancer screening.  Review of Systems  Constitutional:  Negative for chills, fever, malaise/fatigue and weight loss.  HENT:  Negative for congestion, ear pain and sore throat.   Eyes:  Negative for blurred vision, double vision and pain.  Respiratory:  Negative for cough and shortness of breath.   Cardiovascular:  Negative for chest pain and palpitations.  Gastrointestinal:  Negative for abdominal pain, blood in stool, constipation, heartburn and nausea.  Genitourinary:  Negative for dysuria and urgency.  Musculoskeletal:  Negative for joint pain and myalgias.  Neurological:  Negative for dizziness and headaches.  Endo/Heme/Allergies:  Does not bruise/bleed easily.  Psychiatric/Behavioral:  Negative for depression. The patient is not nervous/anxious and does not have insomnia.     Allergies  Allergen Reactions   Codeine Other (See Comments)    itching   Prednisone Other (See Comments)    Declines using this RX - unable to sleep    Past Medical History:  Diagnosis Date   Adjustment disorder    with depressed mood   Allergic state 09/04/2013   per pt/ not sure what reaction   BPH (benign prostatic hyperplasia)    Epicondylitis 09/07/2012   Insomnia    Rectal abscess    history of, s/p surgery   Thyroid disease    hypothyroidism    Past Surgical History:  Procedure Laterality Date   CARPAL TUNNEL RELEASE  02/07/2011   Dr  Sylvester Harder BIL   HAND SURGERY      2 screws in left hand   TONSILLECTOMY     1971    Social History   Socioeconomic History   Marital status: Married    Spouse name: Not on file   Number of children: 0   Years of education: Not on file   Highest education level: Not on file  Occupational History   Occupation: Curator    Employer: [employer]  Tobacco Use   Smoking status: Former    Types: Cigarettes    Quit date: 10/25/1989    Years since quitting: 33.1   Smokeless tobacco: Former    Types: Chew  Substance and Sexual Activity   Alcohol use: Yes    Alcohol/week: 14.0 standard drinks of alcohol    Types: 14 Shots of liquor per week   Drug use: No   Sexual activity: Not on file  Other Topics Concern   Not on file  Social History Narrative   Pt is divorced.              Social Determinants of Health   Financial Resource Strain: Not on file  Food Insecurity: Not on file  Transportation Needs: Not on file  Physical Activity: Not on file  Stress: Not on file  Social Connections: Not on file    Family History  Problem Relation Age of Onset  Hypertension Mother    Hyperlipidemia Mother    Gout Father    Arthritis Father    Memory loss Father    Diverticulitis Sister    Cholelithiasis Sister    Heart disease Maternal Grandfather     Health Maintenance  Topic Date Due   COVID-19 Vaccine (4 - 2023-24 season) 05/15/2023 (Originally 03/21/2022)   Hepatitis C Screening  11/27/2023 (Originally 10/10/1981)   HIV Screening  11/27/2023 (Originally 10/11/1978)   INFLUENZA VACCINE  02/19/2023   COLONOSCOPY (Pts 45-54yrs Insurance coverage will need to be confirmed)  11/09/2023   DTaP/Tdap/Td (3 - Td or Tdap) 07/22/2027   Zoster Vaccines- Shingrix  Completed   HPV VACCINES  Aged Out      ----------------------------------------------------------------------------------------------------------------------------------------------------------------------------------------------------------------- Physical Exam BP 130/81 (BP Location: Left Arm, Patient Position: Sitting, Cuff Size: Normal)   Pulse 65   Ht 5' 10.5" (1.791 m)   Wt 195 lb (88.5 kg)   SpO2 99%   BMI 27.58 kg/m   Physical Exam Constitutional:      General: He is not in acute distress. HENT:     Head: Normocephalic and atraumatic.     Right Ear: Tympanic membrane and external ear normal.     Left Ear: Tympanic membrane and external ear normal.  Eyes:     General: No scleral icterus. Neck:     Thyroid: No thyromegaly.  Cardiovascular:     Rate and Rhythm: Normal rate and regular rhythm.     Heart sounds: Normal heart sounds.  Pulmonary:     Effort: Pulmonary effort is normal.     Breath sounds: Normal breath sounds.  Abdominal:     General: Bowel sounds are normal. There is no distension.     Palpations: Abdomen is soft.     Tenderness: There is no abdominal tenderness. There is no guarding.  Musculoskeletal:     Cervical back: Normal range of motion.  Lymphadenopathy:     Cervical: No cervical adenopathy.  Skin:    General: Skin is warm and dry.     Findings: No rash.  Neurological:     Mental Status: He is alert and oriented to person, place, and time.     Cranial Nerves: No cranial nerve deficit.     Motor: No abnormal muscle tone.  Psychiatric:        Mood and Affect: Mood normal.        Behavior: Behavior normal.     ------------------------------------------------------------------------------------------------------------------------------------------------------------------------------------------------------------------- Assessment and Plan  Well adult exam Well adult Orders Placed This Encounter  Procedures   COMPLETE METABOLIC PANEL WITH GFR   CBC with Differential    Lipid Panel w/reflex Direct LDL   TSH   PSA  Screenings: per lab orders Immunizations:  UTD Anticipatory guidance/risk factor reduction: Recommendations per AVS.    No orders of the defined types were placed in this encounter.   No follow-ups on file.    This visit occurred during the SARS-CoV-2 public health emergency.  Safety protocols were in place, including screening questions prior to the visit, additional usage of staff PPE, and extensive cleaning of exam room while observing appropriate contact time as indicated for disinfecting solutions.

## 2022-11-28 LAB — COMPLETE METABOLIC PANEL WITH GFR
AG Ratio: 2.1 (calc) (ref 1.0–2.5)
ALT: 14 U/L (ref 9–46)
AST: 17 U/L (ref 10–35)
Albumin: 4.5 g/dL (ref 3.6–5.1)
Alkaline phosphatase (APISO): 54 U/L (ref 35–144)
BUN: 10 mg/dL (ref 7–25)
CO2: 26 mmol/L (ref 20–32)
Calcium: 9.6 mg/dL (ref 8.6–10.3)
Chloride: 106 mmol/L (ref 98–110)
Creat: 0.87 mg/dL (ref 0.70–1.30)
Globulin: 2.1 g/dL (calc) (ref 1.9–3.7)
Glucose, Bld: 93 mg/dL (ref 65–99)
Potassium: 4.9 mmol/L (ref 3.5–5.3)
Sodium: 140 mmol/L (ref 135–146)
Total Bilirubin: 0.9 mg/dL (ref 0.2–1.2)
Total Protein: 6.6 g/dL (ref 6.1–8.1)
eGFR: 99 mL/min/{1.73_m2} (ref >=60)

## 2022-11-28 LAB — CBC WITH DIFFERENTIAL/PLATELET
Absolute Monocytes: 531 cells/uL (ref 200–950)
Basophils Absolute: 50 cells/uL (ref 0–200)
Basophils Relative: 1.1 %
Eosinophils Absolute: 149 cells/uL (ref 15–500)
HCT: 40.4 % (ref 38.5–50.0)
Hemoglobin: 14 g/dL (ref 13.2–17.1)
Lymphs Abs: 1548 cells/uL (ref 850–3900)
MCHC: 34.7 g/dL (ref 32.0–36.0)
MCV: 94.8 fL (ref 80.0–100.0)
MPV: 10.2 fL (ref 7.5–12.5)
Monocytes Relative: 11.8 %
Neutro Abs: 2223 cells/uL (ref 1500–7800)
Neutrophils Relative %: 49.4 %
Platelets: 224 10*3/uL (ref 140–400)
RDW: 12.4 % (ref 11.0–15.0)
Total Lymphocyte: 34.4 %

## 2022-11-28 LAB — LIPID PANEL W/REFLEX DIRECT LDL
Cholesterol: 191 mg/dL (ref <200)
HDL: 45 mg/dL (ref >=40)
LDL Cholesterol (Calc): 115 mg/dL (calc) — ABNORMAL HIGH
Non-HDL Cholesterol (Calc): 146 mg/dL (calc) — ABNORMAL HIGH (ref <130)
Total CHOL/HDL Ratio: 4.2 (calc) (ref <5.0)
Triglycerides: 184 mg/dL — ABNORMAL HIGH (ref <150)

## 2022-11-28 LAB — PSA: PSA: 0.23 ng/mL (ref <=4.00)

## 2022-11-28 LAB — TSH: TSH: 2.98 mIU/L (ref 0.40–4.50)

## 2022-12-11 ENCOUNTER — Other Ambulatory Visit: Payer: Self-pay | Admitting: Family Medicine

## 2023-01-03 ENCOUNTER — Other Ambulatory Visit: Payer: Self-pay | Admitting: Family Medicine

## 2023-01-24 ENCOUNTER — Emergency Department (HOSPITAL_COMMUNITY)
Admission: EM | Admit: 2023-01-24 | Discharge: 2023-01-24 | Disposition: A | Discharge status: Home / Self Care | Payer: Worker's Compensation | Attending: Emergency Medicine | Admitting: Emergency Medicine

## 2023-01-24 ENCOUNTER — Other Ambulatory Visit: Payer: Self-pay

## 2023-01-24 ENCOUNTER — Encounter (HOSPITAL_COMMUNITY): Payer: Self-pay | Admitting: Emergency Medicine

## 2023-01-24 DIAGNOSIS — W458XXA Other foreign body or object entering through skin, initial encounter: Secondary | ICD-10-CM | POA: Insufficient documentation

## 2023-01-24 DIAGNOSIS — T1592XA Foreign body on external eye, part unspecified, left eye, initial encounter: Secondary | ICD-10-CM | POA: Diagnosis not present

## 2023-01-24 DIAGNOSIS — S0592XA Unspecified injury of left eye and orbit, initial encounter: Secondary | ICD-10-CM | POA: Diagnosis present

## 2023-01-24 MED ORDER — ERYTHROMYCIN 5 MG/GM OP OINT: TOPICAL_OINTMENT | OPHTHALMIC | 0 refills | Status: DC

## 2023-01-24 MED ORDER — FLUORESCEIN SODIUM 1 MG OP STRP
1.0000 | ORAL_STRIP | Freq: Once | OPHTHALMIC | Status: AC
  Administered 2023-01-24: 1 via OPHTHALMIC
  Filled 2023-01-24: qty 1

## 2023-01-24 MED ORDER — TETRACAINE HCL 0.5 % OP SOLN
2.0000 [drp] | Freq: Once | OPHTHALMIC | Status: AC
  Administered 2023-01-24: 2 [drp] via OPHTHALMIC
  Filled 2023-01-24: qty 4

## 2023-01-24 MED ORDER — MOXIFLOXACIN HCL 0.5 % OP SOLN: 1.0000 [drp] | Freq: Three times a day (TID) | OPHTHALMIC | 0 refills | Status: DC

## 2023-01-24 NOTE — ED Triage Notes (Signed)
Pt with left eye injury from working with metal. Used eye wash station twice without relief.  Feels that it is scratched.  Better when he holds his eye open.

## 2023-01-24 NOTE — ED Provider Notes (Signed)
[place] EMERGENCY DEPARTMENT AT [facility] Provider Note   CSN: [id number] Arrival date & time: 01/24/23  1723     History  Chief Complaint  Patient presents with   Eye Injury    Joel Owen is a 57 y.o. male with overall nocturia past medical history who does not wear contacts who presents with concern for left eye injury while working with metal.  Patient reports that he was sawing a number of materials including metal and had an irritant to the left eye.  He used the eyewash station multiple time but has not had any relief.  He denies any significant vision changes, or pus draining from the eye.  The injury just happened earlier today.   Eye Injury       Home Medications Prior to Admission medications   Medication Sig Start Date End Date Taking? Authorizing Provider  erythromycin ophthalmic ointment Place a 1/2 inch ribbon of ointment into the lower eyelid 4 times daily 01/24/23  Yes Clevester Helzer H, PA-C  moxifloxacin (VIGAMOX) 0.5 % ophthalmic solution Place 1 drop into the left eye 3 (three) times daily. 01/24/23  Yes Aimi Essner H, PA-C  Azelastine HCl 137 MCG/SPRAY SOLN PLACE 2 SPRAYS INTO BOTH NOSTRILS 2 (TWO) TIMES DAILY. USE IN [facility] NOSTRIL AS DIRECTED 11/12/22   Everrett Coombe, DO  levothyroxine (SYNTHROID) 100 MCG tablet TAKE 1 TABLET BY MOUTH EVERY DAY 08/18/22   Everrett Coombe, DO  Multiple Vitamin (MULTIVITAMIN) tablet Take 1 tablet by mouth daily.    [provider]  QUEtiapine (SEROQUEL) 50 MG tablet TAKE 1 TABLET BY MOUTH EVERYDAY AT BEDTIME 01/05/23   Everrett Coombe, DO  sildenafil (VIAGRA) 100 MG tablet TAKE 1 TABLET BY MOUTH EVERY DAY AS NEEDED FOR ERECTILE DYSFUNCTION 09/17/22   Everrett Coombe, DO      Allergies    Codeine and Prednisone    Review of Systems   Review of Systems  All other systems reviewed and are negative.   Physical Exam Updated Vital Signs BP (!) 145/105   Pulse 71   Temp 98.2 F (36.8 C) (Oral)    Resp 16   SpO2 99%  Physical Exam Vitals and nursing note reviewed.  Constitutional:      General: He is not in acute distress.    Appearance: Normal appearance.  HENT:     Head: Normocephalic and atraumatic.  Eyes:     General:        Right eye: No discharge.        Left eye: No discharge.     Comments: Patient with microscopic punctate foreign body at 10-11oclock on iris. Uptake of flouroscein dye in this area. IOP . Negative seidel sign.  Cardiovascular:     Rate and Rhythm: Normal rate and regular rhythm.  Pulmonary:     Effort: Pulmonary effort is normal. No respiratory distress.  Musculoskeletal:        General: No deformity.  Skin:    General: Skin is warm and dry.  Neurological:     Mental Status: He is alert and oriented to person, place, and time.  Psychiatric:        Mood and Affect: Mood normal.        Behavior: Behavior normal.     ED Results / Procedures / Treatments   Labs (all labs ordered are listed, but only abnormal results are displayed) Labs Reviewed - No data to display  EKG None  Radiology No results found.  Procedures Procedures    Medications Ordered in ED Medications  tetracaine (PONTOCAINE) 0.5 % ophthalmic solution 2 drop (2 drops Left Eye Given 01/24/23 1947)  fluorescein ophthalmic strip 1 strip (1 strip Left Eye Given 01/24/23 1947)    ED Course/ Medical Decision Making/ A&P                             Medical Decision Making Risk Prescription drug management.  This patient is a 57 y.o. male who presents to the ED for concern of left eye foreign body versus injury.   Differential diagnoses prior to evaluation: Foreign body, ruptured globe, corneal abrasion, corneal ulcer  Past Medical History / Social History / Additional history: Chart reviewed. Pertinent results include: does not wear contacts, otherwise unremarkable  Physical Exam: Physical exam performed. The pertinent findings include: Patient with microscopic  punctate foreign body at 10-11oclock on iris. Uptake of flouroscein dye in this area. IOP . Negative seidel sign.   Consults: Spoke with the ophthalmologist, Dr. Essie Hart who will see the patient in his office tomorrow, recommend Vigamox, romycin, close follow up  Medications / Treatment: Discharged with Vigamox, erythromycin, and patient to follow-up with ophthalmologist at 3 PM tomorrow   Disposition: After consideration of the diagnostic results and the patients response to treatment, I feel that patient is stable for discharge with plan as above with ocular foreign body plus or minus corneal abrasion  emergency department workup does not suggest an emergent condition requiring admission or immediate intervention beyond what has been performed at this time. The plan is: as above. The patient is safe for discharge and has been instructed to return immediately for worsening symptoms, change in symptoms or any other concerns.  Final Clinical Impression(s) / ED Diagnoses Final diagnoses:  Foreign body of left external eye, initial encounter    Rx / DC Orders ED Discharge Orders          Ordered    moxifloxacin (VIGAMOX) 0.5 % ophthalmic solution  3 times daily        01/24/23 2031    erythromycin ophthalmic ointment        01/24/23 2031              [place] 01/24/23 2114    Wynetta Fines, MD 01/25/23 [phone]

## 2023-01-24 NOTE — Discharge Instructions (Addendum)
Please pick up the medications that I prescribed above, and meet Dr. Essie Hart at his office tomorrow at 3 PM, you can call the number above if someone is not there to meet you upfront.

## 2023-01-26 ENCOUNTER — Telehealth: Payer: Self-pay | Admitting: General Practice

## 2023-01-26 NOTE — Telephone Encounter (Signed)
Patient called. He states he is doing fine. He got a tiny piece of metal in his eye at work and issue has been resolved.

## 2023-01-26 NOTE — Transitions of Care (Post Inpatient/ED Visit) (Signed)
   01/26/2023  Name: Joel Owen MRN: [id number] DOB: 04-16-1966  Today's TOC FU Call Status: Today's TOC FU Call Status:: Unsuccessul Call (1st Attempt) Unsuccessful Call (1st Attempt) Date: 01/26/23  Attempted to reach the patient regarding the most recent Inpatient/ED visit.  Follow Up Plan: Additional outreach attempts will be made to reach the patient to complete the Transitions of Care (Post Inpatient/ED visit) call.   Signature [place]esto Charon, Control and instrumentation engineer

## 2023-01-27 NOTE — Transitions of Care (Post Inpatient/ED Visit) (Signed)
   01/27/2023  Name: Joel Owen MRN: [id number] DOB: [birth year]  Today's TOC FU Call Status: Today's TOC FU Call Status:: Unsuccessful Call (2nd Attempt) Unsuccessful Call (1st Attempt) Date: 01/26/23 Unsuccessful Call (2nd Attempt) Date: 01/27/23  Attempted to reach the patient regarding the most recent Inpatient/ED visit.  Follow Up Plan: No further outreach attempts will be made at this time. We have been unable to contact the patient.  Signature Modesto Charon, Control and instrumentation engineer

## 2023-02-08 ENCOUNTER — Other Ambulatory Visit: Payer: Self-pay | Admitting: Family Medicine

## 2023-02-09 ENCOUNTER — Other Ambulatory Visit: Payer: Self-pay | Admitting: Family Medicine

## 2023-03-03 ENCOUNTER — Other Ambulatory Visit: Payer: Self-pay | Admitting: Family Medicine

## 2023-04-27 ENCOUNTER — Other Ambulatory Visit: Payer: Self-pay | Admitting: Family Medicine

## 2023-05-22 ENCOUNTER — Other Ambulatory Visit: Payer: Self-pay | Admitting: Family Medicine

## 2023-06-13 ENCOUNTER — Other Ambulatory Visit: Payer: Self-pay | Admitting: Family Medicine

## 2023-06-30 ENCOUNTER — Other Ambulatory Visit: Payer: Self-pay | Admitting: Family Medicine

## 2023-07-07 ENCOUNTER — Other Ambulatory Visit: Payer: Self-pay | Admitting: Family Medicine

## 2023-08-04 ENCOUNTER — Other Ambulatory Visit: Payer: Self-pay | Admitting: Family Medicine

## 2023-08-06 ENCOUNTER — Other Ambulatory Visit: Payer: Self-pay | Admitting: Family Medicine

## 2023-08-06 DIAGNOSIS — E039 Hypothyroidism, unspecified: Secondary | ICD-10-CM

## 2023-08-28 ENCOUNTER — Other Ambulatory Visit: Payer: Self-pay | Admitting: Family Medicine

## 2023-09-10 ENCOUNTER — Other Ambulatory Visit: Payer: Self-pay | Admitting: Family Medicine

## 2023-09-29 ENCOUNTER — Other Ambulatory Visit: Payer: Self-pay | Admitting: Family Medicine

## 2023-10-26 ENCOUNTER — Other Ambulatory Visit: Payer: Self-pay | Admitting: Family Medicine

## 2023-11-24 ENCOUNTER — Other Ambulatory Visit: Payer: Self-pay | Admitting: Family Medicine

## 2023-11-27 ENCOUNTER — Encounter (HOSPITAL_COMMUNITY): Payer: Self-pay

## 2023-12-04 ENCOUNTER — Ambulatory Visit (INDEPENDENT_AMBULATORY_CARE_PROVIDER_SITE_OTHER): Payer: 59 | Admitting: Family Medicine

## 2023-12-04 ENCOUNTER — Encounter: Payer: Self-pay | Admitting: Family Medicine

## 2023-12-04 VITALS — BP 120/79 | HR 68 | Ht 71.25 in | Wt 209.0 lb

## 2023-12-04 DIAGNOSIS — B351 Tinea unguium: Secondary | ICD-10-CM

## 2023-12-04 DIAGNOSIS — Z1211 Encounter for screening for malignant neoplasm of colon: Secondary | ICD-10-CM

## 2023-12-04 DIAGNOSIS — E039 Hypothyroidism, unspecified: Secondary | ICD-10-CM | POA: Diagnosis not present

## 2023-12-04 DIAGNOSIS — N400 Enlarged prostate without lower urinary tract symptoms: Secondary | ICD-10-CM | POA: Diagnosis not present

## 2023-12-04 DIAGNOSIS — Z0000 Encounter for general adult medical examination without abnormal findings: Secondary | ICD-10-CM

## 2023-12-04 DIAGNOSIS — E785 Hyperlipidemia, unspecified: Secondary | ICD-10-CM

## 2023-12-04 MED ORDER — CICLOPIROX 8 % EX SOLN: Freq: Every day | CUTANEOUS | 0 refills | Status: DC

## 2023-12-04 NOTE — Assessment & Plan Note (Signed)
 Well adult Orders Placed This Encounter  Procedures   CMP14+EGFR   CBC with Differential/Platelet   PSA   Lipid Panel With LDL/HDL Ratio   TSH   Ambulatory referral to Gastroenterology    Referral Priority:   Routine    Referral Type:   Consultation    Referral Reason:   Specialty Services Required    Number of Visits Requested:   1  Screenings: per lab orders Immunizations:  UTD Anticipatory guidance/risk factor reduction: Recommendations per AVS.

## 2023-12-04 NOTE — Assessment & Plan Note (Signed)
 [place] sent in

## 2023-12-04 NOTE — Patient Instructions (Signed)

## 2023-12-04 NOTE — Progress Notes (Signed)
 Joel Owen - 58 y.o. male MRN [id number]  Date of birth: 08-Aug-1965  Subjective Chief Complaint  Patient presents with   Annual Exam    HPI Joel Owen is a 58 y.o. male here today for annual exam.   He reports that he is doing pretty well.  He doe stay pretty active.  He feels that his diet is pretty good.   He does consume EtOH occasionally.  He uses oral tobacco products.   He is due for colon cancer screening.  Would like referral for colonoscopy.    ROS:  A comprehensive ROS was completed and negative except as noted per HPI  Allergies  Allergen Reactions   Codeine Other (See Comments)    itching   Prednisone  Other (See Comments)    Declines using this RX - unable to sleep    Past Medical History:  Diagnosis Date   Adjustment disorder    with depressed mood   Allergic state 09/04/2013   per pt/ not sure what reaction   BPH (benign prostatic hyperplasia)    Epicondylitis 09/07/2012   Insomnia    Rectal abscess    history of, s/p surgery   Thyroid disease    hypothyroidism    Past Surgical History:  Procedure Laterality Date   CARPAL TUNNEL RELEASE  02/07/2011   Dr  Faith Homes BIL   HAND SURGERY     2 screws in left hand   TONSILLECTOMY     1971    Social History   Socioeconomic History   Marital status: Married    Spouse name: Not on file   Number of children: 0   Years of education: Not on file   Highest education level: Associate degree: academic program  Occupational History   Occupation: Sports administrator: [employer]  Tobacco Use   Smoking status: Former    Current packs/day: 0.00    Types: Cigarettes    Quit date: 10/25/1989    Years since quitting: 34.1   Smokeless tobacco: Former    Types: Chew  Substance and Sexual Activity   Alcohol use: Yes    Alcohol/week: 14.0 standard drinks of alcohol    Types: 14 Shots of liquor per week   Drug use: No   Sexual activity: Not on file  Other Topics Concern   Not on file   Social History Narrative   Pt is divorced.              Social Drivers of Corporate investment banker Strain: Low Risk  (11/30/2023)   Overall Financial Resource Strain (CARDIA)    Difficulty of Paying Living Expenses: Not hard at all  Food Insecurity: No Food Insecurity (11/30/2023)   Hunger Vital Sign    Worried About Running Out of Food in the Last Year: Never true    Ran Out of Food in the Last Year: Never true  Transportation Needs: No Transportation Needs (11/30/2023)   PRAPARE - Administrator, Civil Service (Medical): No    Lack of Transportation (Non-Medical): No  Physical Activity: Insufficiently Active (11/30/2023)   Exercise Vital Sign    Days of Exercise per Week: 4 days    Minutes of Exercise per Session: 30 min  Stress: No Stress Concern Present (11/30/2023)   [employer] of Occupational Health - Occupational Stress Questionnaire    Feeling of Stress : Only a little  Social Connections: Unknown (11/30/2023)   Social Connection and Isolation Panel [  NHANES]    Frequency of Communication with Friends and Family: Once a week    Frequency of Social Gatherings with Friends and Family: Patient declined    Attends Religious Services: More than 4 times per year    Active Member of Clubs or Organizations: Yes    Attends Engineer, structural: More than 4 times per year    Marital Status: Married    Family History  Problem Relation Age of Onset   Hypertension Mother    Hyperlipidemia Mother    Gout Father    Arthritis Father    Memory loss Father    Diverticulitis Sister    Cholelithiasis Sister    Heart disease Maternal Grandfather     Health Maintenance  Topic Date Due   Colonoscopy  11/09/2023   Hepatitis C Screening  12/03/2024 (Originally 10/10/1981)   HIV Screening  12/03/2024 (Originally 10/11/1978)   COVID-19 Vaccine (4 - 2024-25 season) 12/19/2024 (Originally 03/22/2023)   INFLUENZA VACCINE  02/19/2024   DTaP/Tdap/Td (3 - Td or  Tdap) 07/22/2027   Zoster Vaccines- Shingrix   Completed   HPV VACCINES  Aged Out   Meningococcal B Vaccine  Aged Out     ----------------------------------------------------------------------------------------------------------------------------------------------------------------------------------------------------------------- Physical Exam BP 120/79 (BP Location: Left Arm, Patient Position: Sitting, Cuff Size: Normal)   Pulse 68   Ht 5' 10.5" (1.791 m)   Wt 209 lb (94.8 kg)   SpO2 99%   BMI 29.56 kg/m   Physical Exam Constitutional:      General: He is not in acute distress. HENT:     Head: Normocephalic and atraumatic.     Right Ear: Tympanic membrane and external ear normal.     Left Ear: Tympanic membrane and external ear normal.  Eyes:     General: No scleral icterus. Neck:     Thyroid: No thyromegaly.  Cardiovascular:     Rate and Rhythm: Normal rate and regular rhythm.     Heart sounds: Normal heart sounds.  Pulmonary:     Effort: Pulmonary effort is normal.     Breath sounds: Normal breath sounds.  Abdominal:     General: Bowel sounds are normal. There is no distension.     Palpations: Abdomen is soft.     Tenderness: There is no abdominal tenderness. There is no guarding.  Musculoskeletal:     Cervical back: Normal range of motion.  Lymphadenopathy:     Cervical: No cervical adenopathy.  Skin:    General: Skin is warm and dry.     Findings: No rash.  Neurological:     Mental Status: He is alert and oriented to person, place, and time.     Cranial Nerves: No cranial nerve deficit.     Motor: No abnormal muscle tone.  Psychiatric:        Mood and Affect: Mood normal.        Behavior: Behavior normal.     ------------------------------------------------------------------------------------------------------------------------------------------------------------------------------------------------------------------- Assessment and  Plan  Onychomycosis Penlac sent in.   Well adult exam Well adult Orders Placed This Encounter  Procedures   CMP14+EGFR   CBC with Differential/Platelet   PSA   Lipid Panel With LDL/HDL Ratio   TSH   Ambulatory referral to Gastroenterology    Referral Priority:   Routine    Referral Type:   Consultation    Referral Reason:   Specialty Services Required    Number of Visits Requested:   1  Screenings: per lab orders Immunizations:  UTD Anticipatory guidance/risk factor reduction:  Recommendations per AVS.    Meds ordered this encounter  Medications   ciclopirox (PENLAC) 8 % solution    Sig: Apply topically at bedtime. Apply over nail and surrounding skin. Apply daily over previous coat. After seven (7) days, may remove with alcohol and continue cycle.    Dispense:  6.6 mL    Refill:  0    No follow-ups on file.

## 2023-12-05 LAB — CMP14+EGFR
ALT: 18 IU/L (ref 0–44)
AST: 20 IU/L (ref 0–40)
Albumin: 4.5 g/dL (ref 3.8–4.9)
Alkaline Phosphatase: 55 IU/L (ref 44–121)
BUN/Creatinine Ratio: 12 (ref 10–24)
BUN: 12 mg/dL (ref 8–27)
Bilirubin Total: 0.5 mg/dL (ref 0.0–1.2)
CO2: 21 mmol/L (ref 20–29)
Calcium: 9.6 mg/dL (ref 8.6–10.2)
Chloride: 105 mmol/L (ref 96–106)
Creatinine, Ser: 1 mg/dL (ref 0.76–1.27)
Globulin, Total: 1.9 g/dL (ref 1.5–4.5)
Glucose: 95 mg/dL (ref 70–99)
Potassium: 5.3 mmol/L — ABNORMAL HIGH (ref 3.5–5.2)
Sodium: 140 mmol/L (ref 134–144)
Total Protein: 6.4 g/dL (ref 6.0–8.5)
eGFR: 86 mL/min/{1.73_m2} (ref >59)

## 2023-12-05 LAB — CBC WITH DIFFERENTIAL/PLATELET
Basophils Absolute: 0.1 10*3/uL (ref 0.0–0.2)
Basos: 2 %
EOS (ABSOLUTE): 0.1 10*3/uL (ref 0.0–0.4)
Eos: 3 %
Hematocrit: 42 % (ref 37.5–51.0)
Hemoglobin: 14 g/dL (ref 13.0–17.7)
Immature Grans (Abs): 0 10*3/uL (ref 0.0–0.1)
Immature Granulocytes: 0 %
Lymphocytes Absolute: 1.8 10*3/uL (ref 0.7–3.1)
Lymphs: 43 %
MCH: 33 pg (ref 26.6–33.0)
MCHC: 33.3 g/dL (ref 31.5–35.7)
MCV: 99 fL — ABNORMAL HIGH (ref 79–97)
Monocytes Absolute: 0.4 10*3/uL (ref 0.1–0.9)
Monocytes: 11 %
Neutrophils Absolute: 1.6 10*3/uL (ref 1.4–7.0)
Neutrophils: 41 %
Platelets: 223 10*3/uL (ref 150–450)
RBC: 4.24 x10E6/uL (ref 4.14–5.80)
RDW: 12.6 % (ref 11.6–15.4)
WBC: 4 10*3/uL (ref 3.4–10.8)

## 2023-12-05 LAB — LIPID PANEL WITH LDL/HDL RATIO
Cholesterol, Total: 200 mg/dL — ABNORMAL HIGH (ref 100–199)
HDL: 38 mg/dL — ABNORMAL LOW (ref >39)
LDL Chol Calc (NIH): 140 mg/dL — ABNORMAL HIGH (ref 0–99)
LDL/HDL Ratio: 3.7 ratio — ABNORMAL HIGH (ref 0.0–3.6)
Triglycerides: 120 mg/dL (ref 0–149)
VLDL Cholesterol Cal: 22 mg/dL (ref 5–40)

## 2023-12-05 LAB — PSA: Prostate Specific Ag, Serum: 0.3 ng/mL (ref 0.0–4.0)

## 2023-12-05 LAB — TSH: TSH: 1.48 u[IU]/mL (ref 0.450–4.500)

## 2023-12-18 ENCOUNTER — Ambulatory Visit: Payer: Self-pay | Admitting: Family Medicine

## 2023-12-18 DIAGNOSIS — E785 Hyperlipidemia, unspecified: Secondary | ICD-10-CM

## 2023-12-21 MED ORDER — ATORVASTATIN CALCIUM 10 MG PO TABS
10.0000 mg | ORAL_TABLET | Freq: Every day | ORAL | 3 refills | Status: AC
Start: 2023-12-21 — End: ?

## 2023-12-21 NOTE — Telephone Encounter (Signed)
 Last read by Jarvis Mesa at 8:48AM on 12/21/2023.

## 2023-12-21 NOTE — Telephone Encounter (Signed)
 Meds ordered this encounter  Medications   atorvastatin (LIPITOR) 10 MG tablet    Sig: Take 1 tablet (10 mg total) by mouth daily.    Dispense:  90 tablet    Refill:  3    Supervising Provider:   Augustus Ledger, CODY [[id number]]

## 2023-12-22 ENCOUNTER — Other Ambulatory Visit: Payer: Self-pay | Admitting: Family Medicine

## 2023-12-27 ENCOUNTER — Other Ambulatory Visit: Payer: Self-pay | Admitting: Family Medicine

## 2024-01-31 ENCOUNTER — Other Ambulatory Visit: Payer: Self-pay | Admitting: Family Medicine

## 2024-01-31 DIAGNOSIS — E039 Hypothyroidism, unspecified: Secondary | ICD-10-CM

## 2024-02-11 LAB — HM COLONOSCOPY

## 2024-02-11 NOTE — Progress Notes (Signed)
     Outpatient colonoscopy completed. Please see below for a brief description of endoscopic findings, and plan of care moving forward. For a full report, please see colonoscopy note in the media tab dated 02/11/2024.   Endoscopic findings:  Excavated lesions Pancolonic diverticulosis--moderate on the left and mild on the right   Protruding lesions Small internal hemorrhoids were noted.     Plan of care moving forward:  Based on current ASGE guidelines, your next colonoscopy should be in 10 years High fiber diet recommended.    Please feel free to contact me with any questions or concerns   Thank you for allowing me to take part in the care of this patient.     Rosalynn Pottier, M.D., M.B.A.

## 2024-02-17 LAB — LIPID PANEL
Chol/HDL Ratio: 4.4 ratio (ref 0.0–5.0)
Cholesterol, Total: 179 mg/dL (ref 100–199)
HDL: 41 mg/dL (ref >39)
LDL Chol Calc (NIH): 78 mg/dL (ref 0–99)
Triglycerides: 376 mg/dL — ABNORMAL HIGH (ref 0–149)
VLDL Cholesterol Cal: 60 mg/dL — ABNORMAL HIGH (ref 5–40)

## 2024-02-17 LAB — CMP14+EGFR
ALT: 21 IU/L (ref 0–44)
AST: 20 IU/L (ref 0–40)
Albumin: 4.7 g/dL (ref 3.8–4.9)
Alkaline Phosphatase: 78 IU/L (ref 44–121)
BUN/Creatinine Ratio: 13 (ref 10–24)
BUN: 13 mg/dL (ref 8–27)
Bilirubin Total: 0.4 mg/dL (ref 0.0–1.2)
CO2: 20 mmol/L (ref 20–29)
Calcium: 9.7 mg/dL (ref 8.6–10.2)
Chloride: 106 mmol/L (ref 96–106)
Creatinine, Ser: 0.97 mg/dL (ref 0.76–1.27)
Globulin, Total: 2.2 g/dL (ref 1.5–4.5)
Glucose: 117 mg/dL — ABNORMAL HIGH (ref 70–99)
Potassium: 5.2 mmol/L (ref 3.5–5.2)
Sodium: 144 mmol/L (ref 134–144)
Total Protein: 6.9 g/dL (ref 6.0–8.5)
eGFR: 89 mL/min/1.73 (ref >59)

## 2024-04-04 ENCOUNTER — Encounter: Payer: Self-pay | Admitting: Family Medicine

## 2024-04-04 MED ORDER — QUETIAPINE FUMARATE 50 MG PO TABS: 50.0000 mg | ORAL_TABLET | Freq: Every day | ORAL | 0 refills | Status: DC

## 2024-04-05 ENCOUNTER — Other Ambulatory Visit: Payer: Self-pay | Admitting: Family Medicine

## 2024-04-12 ENCOUNTER — Ambulatory Visit: Payer: Self-pay | Admitting: *Deleted

## 2024-04-12 NOTE — Telephone Encounter (Signed)
 FYI Only or Action Required?: FYI only for provider.  Patient was last seen in primary care on 12/04/2023 by Alvia Bring, DO.  Called Nurse Triage reporting Cough (cough).  Symptoms began several months ago.  Interventions attempted: OTC medications: Claritin.  Symptoms are: unchanged.  Triage Disposition: See PCP Within 2 Weeks  Patient/caregiver understands and will follow disposition?: Yes   Reason for Disposition  [1] Cough lasts > 3 weeks AND [2] recent cold symptoms (e.g., runny nose, fever)  Answer Assessment - Initial Assessment Questions 1. ONSET: When did the cough begin?      3 months 2. SEVERITY: How bad is the cough today?      Mostly during the day 3. SPUTUM: Describe the color of your sputum (e.g., none, dry cough; clear, white, yellow, green)     Thick, clear mucus 4. HEMOPTYSIS: Are you coughing up any blood? If so ask: How much? (e.g., flecks, streaks, tablespoons, etc.)     no 5. DIFFICULTY BREATHING: Are you having difficulty breathing? If Yes, ask: How bad is it? (e.g., mild, moderate, severe)      no 6. FEVER: Do you have a fever? If Yes, ask: What is your temperature, how was it measured, and when did it start?     no 7. CARDIAC HISTORY: Do you have any history of heart disease? (e.g., heart attack, congestive heart failure)      no 8. LUNG HISTORY: Do you have any history of lung disease?  (e.g., pulmonary embolus, asthma, emphysema)     Hx pneumonia   10. OTHER SYMPTOMS: Do you have any other symptoms? (e.g., runny nose, wheezing, chest pain)       Chest tight at times  Protocols used: Cough - Chronic-A-AH   Copied from CRM #[id number]. Topic: Clinical - Red Word Triage >> Apr 12, 2024  4:12 PM Merlynn A wrote: Red Word that prompted transfer to Nurse Triage: Possible Pneumonia, coughing excessively

## 2024-04-14 ENCOUNTER — Ambulatory Visit (INDEPENDENT_AMBULATORY_CARE_PROVIDER_SITE_OTHER): Admitting: Medical-Surgical

## 2024-04-14 ENCOUNTER — Ambulatory Visit

## 2024-04-14 ENCOUNTER — Encounter: Payer: Self-pay | Admitting: Medical-Surgical

## 2024-04-14 VITALS — BP 130/80 | HR 74 | Temp 98.9°F | Resp 20 | Ht 71.25 in | Wt 209.0 lb

## 2024-04-14 DIAGNOSIS — R053 Chronic cough: Secondary | ICD-10-CM | POA: Diagnosis not present

## 2024-04-14 MED ORDER — ALBUTEROL SULFATE HFA 108 (90 BASE) MCG/ACT IN AERS: 2.0000 | INHALATION_SPRAY | Freq: Four times a day (QID) | RESPIRATORY_TRACT | 0 refills | Status: DC | PRN

## 2024-04-14 MED ORDER — PREDNISONE 50 MG PO TABS: 50.0000 mg | ORAL_TABLET | Freq: Every day | ORAL | 0 refills | Status: DC

## 2024-04-14 NOTE — Progress Notes (Signed)
        Established patient visit   History of Present Illness   Discussed the use of AI scribe software for clinical note transcription with the patient, who gave verbal consent to proceed.  History of Present Illness   Joel Owen is a 58 year old male who presents with a persistent cough.  Cough and sputum production - Persistent cough for 3 months, worsening in the afternoons - Cough does not affect sleep - Occasional production of white, thick sputum with slight lemon color noted this morning - Cough onset preceded a head cold in August with postnasal drip - Cough has slightly lessened over the past two weeks - No unusual fatigue, night sweats, weight changes, chills, fever, or chest pain - No leg swelling - Temperature usually below 98.45F  Allergic rhinitis and environmental exposures - History of spring and fall allergies - Works in a dusty environment - Claritin provides relief for sinus symptoms but not for pulmonary symptoms  Gastrointestinal symptoms - No heartburn or acid reflux - Stomach pain occurs once or twice a year, treated with Nexium for a couple of days with full resolution of symptoms  Pulmonary history - No history of asthma - Past pneumonia in the left lung, which was asymptomatic until diagnosis - History of bronchitis several times in the past, but not in the last fifteen years - Albuterol  inhaler used in the past with symptom relief  Cardiac risk factors - No history of heart issues - No chest pain - No smoking history    Physical Exam   Physical Exam Vitals and nursing note reviewed.  Constitutional:      General: He is not in acute distress.    Appearance: Normal appearance. He is not ill-appearing.  HENT:     Head: Normocephalic and atraumatic.  Cardiovascular:     Rate and Rhythm: Normal rate and regular rhythm.     Pulses: Normal pulses.     Heart sounds: Normal heart sounds. No murmur heard.    No friction rub. No gallop.   Pulmonary:     Effort: Pulmonary effort is normal. No respiratory distress.     Breath sounds: Normal breath sounds.  Skin:    General: Skin is warm and dry.  Neurological:     Mental Status: He is alert and oriented to person, place, and time.  Psychiatric:        Mood and Affect: Mood normal.        Behavior: Behavior normal.        Thought Content: Thought content normal.        Judgment: Judgment normal.    Assessment & Plan     Chronic cough Chronic cough with afternoon predominance, occasional sputum. Differential includes LPR, CHF severe allergy, or viral infection. Claritin ineffective for lung symptoms. - Order chest x-ray. - Prescribe prednisone  50mg  daily for a 5-day burst, once daily in the morning; intolerance noted but patient agreed to take the medication. - Continue Claritin for allergy management. - Prescribe albuterol  inhaler for shortness of breath and wheezing. - Consider trial of Nexium for two weeks for potential silent reflux.  Follow up   Return if symptoms worsen or fail to improve. __________________________________ Zada FREDRIK Palin, DNP, APRN, FNP-BC Primary Care and Sports Medicine [facility] [place]

## 2024-04-15 ENCOUNTER — Encounter: Payer: Self-pay | Admitting: Gastroenterology

## 2024-04-17 ENCOUNTER — Other Ambulatory Visit: Payer: Self-pay | Admitting: Family Medicine

## 2024-04-19 ENCOUNTER — Ambulatory Visit: Payer: Self-pay | Admitting: Medical-Surgical

## 2024-04-26 ENCOUNTER — Other Ambulatory Visit: Payer: Self-pay | Admitting: Family Medicine

## 2024-05-02 ENCOUNTER — Other Ambulatory Visit: Payer: Self-pay | Admitting: Family Medicine

## 2024-05-25 ENCOUNTER — Other Ambulatory Visit: Payer: Self-pay | Admitting: Family Medicine

## 2024-06-09 ENCOUNTER — Encounter: Payer: Self-pay | Admitting: Family Medicine

## 2024-06-11 ENCOUNTER — Other Ambulatory Visit: Payer: Self-pay | Admitting: Family Medicine

## 2024-06-13 NOTE — Telephone Encounter (Signed)
 Pls contact pt to schedule 6 month appt for medication refills. Past due.

## 2024-06-21 ENCOUNTER — Encounter: Payer: Self-pay | Admitting: Family Medicine

## 2024-06-21 ENCOUNTER — Ambulatory Visit: Admitting: Family Medicine

## 2024-06-21 VITALS — BP 152/99 | HR 87 | Ht 71.25 in | Wt 205.0 lb

## 2024-06-21 DIAGNOSIS — E785 Hyperlipidemia, unspecified: Secondary | ICD-10-CM | POA: Diagnosis not present

## 2024-06-21 DIAGNOSIS — F5101 Primary insomnia: Secondary | ICD-10-CM | POA: Diagnosis not present

## 2024-06-21 DIAGNOSIS — E039 Hypothyroidism, unspecified: Secondary | ICD-10-CM

## 2024-06-21 DIAGNOSIS — R030 Elevated blood-pressure reading, without diagnosis of hypertension: Secondary | ICD-10-CM | POA: Insufficient documentation

## 2024-06-21 NOTE — Assessment & Plan Note (Signed)
Continue Seroquel at current strength. 

## 2024-06-21 NOTE — Progress Notes (Signed)
 Joel Owen - 58 y.o. male MRN [id number]  Date of birth: Sep 15, 1965  Subjective Chief Complaint  Patient presents with   Medication Refill    HPI Joel Owen is a 58 y.o. male here today for follow-up visit.  Overall he is doing pretty well.  Tolerating atorvastatin  well for management of hyperlipidemia.  Has had a chronic cough.  Did hold atorvastatin  for a few days and colchicine improved.  No myalgias.    Compliant with levothyroxine .  Doing well on current strength.  Insomnia stable with Seroquel  at current strength.  ROS:  A comprehensive ROS was completed and negative except as noted per HPI  Allergies  Allergen Reactions   Codeine Other (See Comments)    itching   Prednisone  Other (See Comments)    Declines using this RX - unable to sleep    Past Medical History:  Diagnosis Date   Adjustment disorder    with depressed mood   Allergic state 09/04/2013   per pt/ not sure what reaction   BPH (benign prostatic hyperplasia)    Epicondylitis 09/07/2012   Insomnia    Rectal abscess    history of, s/p surgery   Thyroid disease    hypothyroidism    Past Surgical History:  Procedure Laterality Date   CARPAL TUNNEL RELEASE  02/07/2011   Dr  Charlie Lessen BIL   HAND SURGERY     2 screws in left hand   TONSILLECTOMY     1971    Social History   Socioeconomic History   Marital status: Married    Spouse name: Not on file   Number of children: 0   Years of education: Not on file   Highest education level: Associate degree: occupational, scientist, product/process development, or vocational program  Occupational History   Occupation: Sports Administrator: [employer]  Tobacco Use   Smoking status: Former    Current packs/day: 0.00    Types: Cigarettes    Quit date: 10/25/1989    Years since quitting: 34.6   Smokeless tobacco: Former    Types: Chew  Substance and Sexual Activity   Alcohol use: Yes    Alcohol/week: 14.0 standard drinks of alcohol    Types: 14 Shots of liquor  per week   Drug use: No   Sexual activity: Not on file  Other Topics Concern   Not on file  Social History Narrative   Pt is divorced.              Social Drivers of Corporate Investment Banker Strain: Low Risk  (06/17/2024)   Overall Financial Resource Strain (CARDIA)    Difficulty of Paying Living Expenses: Not hard at all  Food Insecurity: No Food Insecurity (06/17/2024)   Hunger Vital Sign    Worried About Running Out of Food in the Last Year: Never true    Ran Out of Food in the Last Year: Never true  Transportation Needs: No Transportation Needs (06/17/2024)   PRAPARE - Administrator, Civil Service (Medical): No    Lack of Transportation (Non-Medical): No  Physical Activity: Sufficiently Active (06/17/2024)   Exercise Vital Sign    Days of Exercise per Week: 5 days    Minutes of Exercise per Session: 60 min  Stress: No Stress Concern Present (06/17/2024)   [employer] of Occupational Health - Occupational Stress Questionnaire    Feeling of Stress: Only a little  Social Connections: Moderately Integrated (06/17/2024)   Social Connection  and Isolation Panel    Frequency of Communication with Friends and Family: Once a week    Frequency of Social Gatherings with Friends and Family: Once a week    Attends Religious Services: More than 4 times per year    Active Member of [employer] or Organizations: Yes    Attends Banker Meetings: 1 to 4 times per year    Marital Status: Married    Family History  Problem Relation Age of Onset   Hypertension Mother    Hyperlipidemia Mother    Gout Father    Arthritis Father    Memory loss Father    Diverticulitis Sister    Cholelithiasis Sister    Heart disease Maternal Grandfather     Health Maintenance  Topic Date Due   Influenza Vaccine  10/18/2024 (Originally 02/19/2024)   Hepatitis C Screening  12/03/2024 (Originally 10/10/1981)   HIV Screening  12/03/2024 (Originally 10/11/1978)   Pneumococcal  Vaccine: 50+ Years (1 of 1 - PCV) 06/21/2025 (Originally 10/10/2013)   COVID-19 Vaccine (4 - 2025-26 season) 07/07/2025 (Originally 03/21/2024)   DTaP/Tdap/Td (3 - Td or Tdap) 07/22/2027   Colonoscopy  02/10/2034   Zoster Vaccines- Shingrix   Completed   Hepatitis B Vaccines 19-59 Average Risk  Aged Out   HPV VACCINES  Aged Out   Meningococcal B Vaccine  Aged Out     ----------------------------------------------------------------------------------------------------------------------------------------------------------------------------------------------------------------- Physical Exam BP (!) 152/99   Pulse 87   Ht 5' 11.25 (1.81 m)   Wt 205 lb (93 kg)   SpO2 98%   BMI 28.39 kg/m   Physical Exam Constitutional:      Appearance: Normal appearance.  Eyes:     General: No scleral icterus. Cardiovascular:     Rate and Rhythm: Normal rate and regular rhythm.  Pulmonary:     Effort: Pulmonary effort is normal.     Breath sounds: Normal breath sounds.  Musculoskeletal:     Cervical back: Neck supple.  Neurological:     Mental Status: He is alert.  Psychiatric:        Mood and Affect: Mood normal.        Behavior: Behavior normal.     ------------------------------------------------------------------------------------------------------------------------------------------------------------------------------------------------------------------- Assessment and Plan  Hypothyroidism Doing well with current strength of levothyroxine .  Will plan to continue.  Hyperlipidemia Doing well with atorvastatin  at current strength.  Primary insomnia Continue Seroquel  at current strength.  Elevated blood pressure reading Blood pressure is elevated today.  Recommend return in 2 to 3 weeks for blood pressure recheck.   No orders of the defined types were placed in this encounter.   No follow-ups on file.

## 2024-06-21 NOTE — Assessment & Plan Note (Signed)
 Blood pressure is elevated today.  Recommend return in 2 to 3 weeks for blood pressure recheck.

## 2024-06-21 NOTE — Assessment & Plan Note (Signed)
Doing well with atorvastatin at current strength.

## 2024-06-21 NOTE — Assessment & Plan Note (Signed)
Doing well with current strength of levothyroxine.  Will plan to continue.

## 2024-06-22 LAB — CMP14+EGFR
ALT: 21 IU/L (ref 0–44)
AST: 24 IU/L (ref 0–40)
Albumin: 5.1 g/dL — ABNORMAL HIGH (ref 3.8–4.9)
Alkaline Phosphatase: 64 IU/L (ref 47–123)
BUN/Creatinine Ratio: 9 — ABNORMAL LOW (ref 10–24)
BUN: 10 mg/dL (ref 8–27)
Bilirubin Total: 1.2 mg/dL (ref 0.0–1.2)
CO2: 22 mmol/L (ref 20–29)
Calcium: 9.9 mg/dL (ref 8.6–10.2)
Chloride: 100 mmol/L (ref 96–106)
Creatinine, Ser: 1.06 mg/dL (ref 0.76–1.27)
Globulin, Total: 2 g/dL (ref 1.5–4.5)
Glucose: 101 mg/dL — ABNORMAL HIGH (ref 70–99)
Potassium: 4.9 mmol/L (ref 3.5–5.2)
Sodium: 138 mmol/L (ref 134–144)
Total Protein: 7.1 g/dL (ref 6.0–8.5)
eGFR: 80 mL/min/1.73 (ref >59)

## 2024-06-22 LAB — LIPID PANEL WITH LDL/HDL RATIO
Cholesterol, Total: 200 mg/dL — ABNORMAL HIGH (ref 100–199)
HDL: 50 mg/dL (ref >39)
LDL Chol Calc (NIH): 99 mg/dL (ref 0–99)
LDL/HDL Ratio: 2 ratio (ref 0.0–3.6)
Triglycerides: 304 mg/dL — ABNORMAL HIGH (ref 0–149)
VLDL Cholesterol Cal: 51 mg/dL — ABNORMAL HIGH (ref 5–40)

## 2024-06-22 LAB — TSH: TSH: 3.24 u[IU]/mL (ref 0.450–4.500)

## 2024-07-07 MED ORDER — ROSUVASTATIN CALCIUM 10 MG PO TABS: 10.0000 mg | ORAL_TABLET | Freq: Every day | ORAL | 1 refills | Status: AC

## 2024-07-28 ENCOUNTER — Other Ambulatory Visit: Payer: Self-pay | Admitting: Family Medicine

## 2024-07-28 DIAGNOSIS — E039 Hypothyroidism, unspecified: Secondary | ICD-10-CM

## 2024-07-30 ENCOUNTER — Other Ambulatory Visit: Payer: Self-pay | Admitting: Family Medicine

## 2024-08-24 ENCOUNTER — Other Ambulatory Visit: Payer: Self-pay | Admitting: Family Medicine
